# Patient Record
Sex: Female | Born: 1985
Health system: Southern US, Community
[De-identification: ages and names within clinical notes are randomized; demographics above are authoritative.]

## PROBLEM LIST (undated history)

## (undated) DIAGNOSIS — F329 Major depressive disorder, single episode, unspecified: Secondary | ICD-10-CM

## (undated) DIAGNOSIS — F419 Anxiety disorder, unspecified: Secondary | ICD-10-CM

## (undated) DIAGNOSIS — F32A Depression, unspecified: Secondary | ICD-10-CM

## (undated) HISTORY — DX: Anxiety disorder, unspecified: F41.9

## (undated) HISTORY — DX: Depression, unspecified: F32.A

---
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
## 2013-12-08 DIAGNOSIS — R55 Syncope and collapse: Secondary | ICD-10-CM

## 2013-12-08 HISTORY — DX: Syncope and collapse: R55

## 2014-12-08 HISTORY — PX: PACEMAKER IMPLANT: EP1218

## 2019-11-01 ENCOUNTER — Encounter: Payer: Self-pay | Admitting: Registered Nurse

## 2019-11-01 ENCOUNTER — Ambulatory Visit (INDEPENDENT_AMBULATORY_CARE_PROVIDER_SITE_OTHER): Payer: 59 | Admitting: Registered Nurse

## 2019-11-01 ENCOUNTER — Other Ambulatory Visit: Payer: Self-pay

## 2019-11-01 VITALS — BP 102/66 | HR 73 | Temp 99.4°F | Wt 144.2 lb

## 2019-11-01 DIAGNOSIS — F419 Anxiety disorder, unspecified: Secondary | ICD-10-CM | POA: Diagnosis not present

## 2019-11-01 DIAGNOSIS — Z950 Presence of cardiac pacemaker: Secondary | ICD-10-CM | POA: Diagnosis not present

## 2019-11-01 DIAGNOSIS — R55 Syncope and collapse: Secondary | ICD-10-CM

## 2019-11-01 DIAGNOSIS — Z0000 Encounter for general adult medical examination without abnormal findings: Secondary | ICD-10-CM

## 2019-11-01 DIAGNOSIS — F329 Major depressive disorder, single episode, unspecified: Secondary | ICD-10-CM

## 2019-11-01 DIAGNOSIS — Z0001 Encounter for general adult medical examination with abnormal findings: Secondary | ICD-10-CM

## 2019-11-01 DIAGNOSIS — F32A Depression, unspecified: Secondary | ICD-10-CM

## 2019-11-01 MED ORDER — BUSPIRONE HCL 7.5 MG PO TABS: 7.5000 mg | ORAL_TABLET | Freq: Three times a day (TID) | ORAL | 0 refills | Status: DC
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
## 2019-11-01 NOTE — Patient Instructions (Signed)
° ° ° °  If you have lab work done today you will be contacted with your lab results within the next 2 weeks.  If you have not heard from us then please contact us. The fastest way to get your results is to register for My Chart. ° ° °IF you received an x-ray today, you will receive an invoice from [facility]. Please contact [facility] at [phone] with questions or concerns regarding your invoice.  ° °IF you received labwork today, you will receive an invoice from [facility]. Please contact [facility] at [phone] with questions or concerns regarding your invoice.  ° °Our billing staff will not be able to assist you with questions regarding bills from these companies. ° °You will be contacted with the lab results as soon as they are available. The fastest way to get your results is to activate your My Chart account. Instructions are located on the last page of this paperwork. If you have not heard from us regarding the results in 2 weeks, please contact this office. °  ° ° ° °

## 2019-11-09 ENCOUNTER — Other Ambulatory Visit: Payer: Self-pay | Admitting: Registered Nurse

## 2019-11-09 DIAGNOSIS — F419 Anxiety disorder, unspecified: Secondary | ICD-10-CM

## 2019-11-09 NOTE — Telephone Encounter (Signed)
Requested medication (s) are due for refill today: yes  Requested medication (s) are on the active medication list: yes  Last refill:  11/02/2019  Future visit scheduled: no  Notes to clinic:  Requesting a 90 day supply Review for refill   Requested Prescriptions  Pending Prescriptions Disp Refills   busPIRone (BUSPAR) 7.5 MG tablet [Pharmacy Med Name: BUSPIRONE HCL 7.5 MG TABLET] 270 tablet 1    Sig: Take 1 tablet (7.5 mg total) by mouth 3 (three) times daily.     Psychiatry: Anxiolytics/Hypnotics - Non-controlled Passed - 11/09/2019  8:30 AM      Passed - Valid encounter within last 6 months    Recent Outpatient Visits          1 week ago Annual physical exam   [facility], Delfino Lovett, NP      Future Appointments            In 6 days Tobb, Godfrey Pick, DO [place]

## 2019-11-09 NOTE — Telephone Encounter (Signed)
Patient would like a 90 day supply. She has an upcoming appt with you on  11/28/19

## 2019-11-14 ENCOUNTER — Encounter: Payer: Self-pay | Admitting: Registered Nurse

## 2019-11-14 ENCOUNTER — Ambulatory Visit (INDEPENDENT_AMBULATORY_CARE_PROVIDER_SITE_OTHER): Payer: 59 | Admitting: Registered Nurse

## 2019-11-14 ENCOUNTER — Other Ambulatory Visit: Payer: Self-pay

## 2019-11-14 VITALS — BP 113/63 | HR 80 | Temp 98.7°F | Resp 12 | Ht 67.25 in | Wt 143.0 lb

## 2019-11-14 DIAGNOSIS — S46811A Strain of other muscles, fascia and tendons at shoulder and upper arm level, right arm, initial encounter: Secondary | ICD-10-CM | POA: Diagnosis not present

## 2019-11-14 MED ORDER — CYCLOBENZAPRINE HCL 10 MG PO TABS: 10.0000 mg | ORAL_TABLET | Freq: Three times a day (TID) | ORAL | 0 refills | Status: DC | PRN

## 2019-11-14 MED ORDER — MELOXICAM 15 MG PO TABS: 15.0000 mg | ORAL_TABLET | Freq: Every day | ORAL | 0 refills | Status: DC

## 2019-11-14 NOTE — Progress Notes (Signed)
Established Patient Office Visit  Subjective:  Patient ID: Kelsey Ewing, female    DOB: 11/03/1986  Age: 33 y.o. MRN: [id number]  CC:  Chief Complaint  Patient presents with  . Neck Injury    pt stated-riding horse that stumble---heard something popped, pain when turning the head down, tingling sensation Rt shoulder/arm--1 week. PHQ-6,  GAD7=4.    HPI Kelsey Ewing presents for R shoulder and back pain.  Ongoing for a little over a week. Recently [place] time [place] of [place] with family - sleeping on bed she did not think was right for her. Then was horseback riding on Saturday - said horse stumbled on a step, felt an acute injury occur on the R side of her back. Pain R of spine, from C7 to T10. Radiates towards shoulder and posterior of axilla. No pain in ROM of shoulder. R arm feels "off", but denies numbness, tingling, weakness. Denies systemic neurological symptoms - no changes in lower extremities, no loss of bowel or bladder control  Past Medical History:  Diagnosis Date  . Anxiety   . Depression   . Neurocardiogenic syncope 2015    Past Surgical History:  Procedure Laterality Date  . PACEMAKER IMPLANT  2016    Family History  Problem Relation Age of Onset  . Hyperlipidemia Mother   . Diabetes Father   . Hypertension Father     Social History   Socioeconomic History  . Marital status: Single    Spouse name: Not on file  . Number of children: 0  . Years of education: Not on file  . Highest education level: Not on file  Occupational History  . Occupation: Software engineer  Social Needs  . Financial resource strain: Not hard at all  . Food insecurity    Worry: Never true    Inability: Never true  . Transportation needs    Medical: No    Non-medical: No  Tobacco Use  . Smoking status: Never Smoker  . Smokeless tobacco: Never Used  Substance and Sexual Activity  . Alcohol use: Yes    Comment: 0-8 a week  . Drug use: Never  . Sexual activity: Yes   Lifestyle  . Physical activity    Days per week: Patient refused    Minutes per session: Patient refused  . Stress: To some extent  Relationships  . Social Herbalist on phone: Three times a week    Gets together: Twice a week    Attends religious service: Patient refused    Active member of club or organization: Patient refused    Attends meetings of clubs or organizations: Patient refused    Relationship status: Patient refused  . Intimate partner violence    Fear of current or ex partner: No    Emotionally abused: No    Physically abused: No    Forced sexual activity: No  Other Topics Concern  . Not on file  Social History Narrative  . Not on file    Outpatient Medications Prior to Visit  Medication Sig Dispense Refill  . busPIRone (BUSPAR) 7.5 MG tablet TAKE 1 TABLET (7.5 MG TOTAL) BY MOUTH 3 (THREE) TIMES DAILY. 270 tablet 1   No facility-administered medications prior to visit.     No Known Allergies  ROS Review of Systems  Constitutional: Negative.   HENT: Negative.   Eyes: Negative.   Respiratory: Negative.   Cardiovascular: Negative.   Gastrointestinal: Negative.   Endocrine: Negative.   Genitourinary: Negative.  Musculoskeletal: Positive for arthralgias, back pain and myalgias.  Skin: Negative.   Allergic/Immunologic: Negative.   Neurological: Negative.   Hematological: Negative.   Psychiatric/Behavioral: Negative.   All other systems reviewed and are negative.     Objective:    Physical Exam  Constitutional: She is oriented to person, place, and time. She appears well-developed and well-nourished. No distress.  Cardiovascular: Normal rate and regular rhythm.  Pulmonary/Chest: Effort normal and breath sounds normal. No respiratory distress.  Musculoskeletal:     Right shoulder: She exhibits tenderness and bony tenderness. She exhibits normal range of motion and no swelling.     Cervical back: She exhibits tenderness. She exhibits  normal range of motion, no bony tenderness, no swelling, no edema and no deformity.     Thoracic back: She exhibits tenderness. She exhibits normal range of motion, no bony tenderness, no swelling, no edema and no deformity.       Back:  Neurological: She is alert and oriented to person, place, and time.  Skin: Skin is warm and dry. No rash noted. She is not diaphoretic. No erythema. No pallor.  Psychiatric: She has a normal mood and affect. Her behavior is normal. Judgment and thought content normal.  Nursing note and vitals reviewed.   BP 113/63 (BP Location: Right Arm, Patient Position: Sitting, Cuff Size: Normal)   Pulse 80   Temp 98.7 F (37.1 C)   Resp 12   Ht 5' 7.25" (1.708 m)   Wt 143 lb (64.9 kg)   LMP 11/12/2019   SpO2 97%   BMI 22.23 kg/m  Wt Readings from Last 3 Encounters:  11/14/19 143 lb (64.9 kg)  11/01/19 144 lb 3.2 oz (65.4 kg)     There are no preventive care reminders to display for this patient.  There are no preventive care reminders to display for this patient.  No results found for: TSH No results found for: WBC, HGB, HCT, MCV, PLT No results found for: NA, K, CHLORIDE, CO2, GLUCOSE, BUN, CREATININE, BILITOT, ALKPHOS, AST, ALT, PROT, ALBUMIN, CALCIUM, ANIONGAP, EGFR, GFR No results found for: CHOL No results found for: HDL No results found for: LDLCALC No results found for: TRIG No results found for: CHOLHDL No results found for: HGBA1C    Assessment & Plan:   Problem List Items Addressed This Visit    None    Visit Diagnoses    Trapezius muscle strain, right, initial encounter    -  Primary   Relevant Medications   meloxicam (MOBIC) 15 MG tablet   cyclobenzaprine (FLEXERIL) 10 MG tablet   Other Relevant Orders   Ambulatory referral to Physical Therapy      Meds ordered this encounter  Medications  . meloxicam (MOBIC) 15 MG tablet    Sig: Take 1 tablet (15 mg total) by mouth daily.    Dispense:  30 tablet    Refill:  0    Order  Specific Question:   Supervising Provider    Answer:   Delia Chimes A [id number]  . cyclobenzaprine (FLEXERIL) 10 MG tablet    Sig: Take 1 tablet (10 mg total) by mouth 3 (three) times daily as needed for muscle spasms.    Dispense:  30 tablet    Refill:  0    Order Specific Question:   Supervising Provider    Answer:   Forrest Moron [id number]    Follow-up: No follow-ups on file.   PLAN  Trapezius muscles strain  Flexeril and meloxicam  Referral to PT  Full ROM intact  Patient encouraged to call clinic with any questions, comments, or concerns.   Maximiano Coss, NP

## 2019-11-15 ENCOUNTER — Ambulatory Visit: Payer: 59 | Admitting: Cardiology

## 2019-11-16 ENCOUNTER — Encounter: Payer: Self-pay | Admitting: Cardiology

## 2019-11-16 ENCOUNTER — Ambulatory Visit (INDEPENDENT_AMBULATORY_CARE_PROVIDER_SITE_OTHER): Payer: 59 | Admitting: Cardiology

## 2019-11-16 ENCOUNTER — Other Ambulatory Visit: Payer: Self-pay

## 2019-11-16 ENCOUNTER — Ambulatory Visit (HOSPITAL_BASED_OUTPATIENT_CLINIC_OR_DEPARTMENT_OTHER)
Admission: RE | Admit: 2019-11-16 | Discharge: 2019-11-16 | Disposition: A | Discharge status: Home / Self Care | Payer: 59 | Source: Ambulatory Visit | Attending: Cardiology | Admitting: Cardiology

## 2019-11-16 VITALS — BP 100/60 | HR 72 | Ht 67.25 in | Wt 146.0 lb

## 2019-11-16 DIAGNOSIS — Z950 Presence of cardiac pacemaker: Secondary | ICD-10-CM | POA: Diagnosis present

## 2019-11-16 DIAGNOSIS — R55 Syncope and collapse: Secondary | ICD-10-CM | POA: Diagnosis not present

## 2019-11-16 DIAGNOSIS — I495 Sick sinus syndrome: Secondary | ICD-10-CM | POA: Diagnosis not present

## 2019-11-16 NOTE — Progress Notes (Signed)
Cardiology Office Note:    Date:  11/16/2019   ID:  Kelsey Ewing, DOB 1986-05-03, MRN [id number]  PCP:  Janeece Agee, NP  Cardiologist:  Thomasene Ripple, DO  Electrophysiologist:  None   Referring MD: Janeece Agee, NP   Chief Complaint  Patient presents with  . Establish Care    Has pacemker    History of Present Illness:    Kelsey Ewing is a 33 y.o. female with a hx of sick sinus syndrome status status post Medtronic pacemaker (patient this is a single lead RA pacemaker) implanted in August 2016, neurocardiogenic shock, and anxiety and depression. The patient presents today to establish cardiac care.  She moved from [place] in January 2019 to [place].  She tells me that teenager she had multiple seizures school as well as to both fainting spells and at that time was checked out with no clear explanation.  She states that while she was in college she did have similar episodes.  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  States that the soup was feeling of she sensation while she was standing and soon will have what she described" dj vu" feeling like someone is either walking out and she will soon [place] for few minutes.  She states that during her work-up [place] she wore a Holter monitor 3 days and her initial Holter monitor sure 10-second pause.  Then she was placed in 1 longer duration of ambulatory monitor up to 30 days event monitor showed multiple episodes of sinus pauses mostly 10 seconds.  With this it was recommended that the patient undergo pacemaker implantation for sick sinus syndrome.  She tells me that her last pacemaker interrogation was 2018 at the later half of the year at that time she was told that everything was within normal limits.  Today she does not offer any complaints at this time, she denies any chest pain, shortness of breath, dizziness or any syncope episodes.  Past Medical History:  Diagnosis Date  . Anxiety   . Depression    . Neurocardiogenic syncope 2015    Past Surgical History:  Procedure Laterality Date  . PACEMAKER IMPLANT  2016    Current Medications: Current Meds  Medication Sig  . busPIRone (BUSPAR) 7.5 MG tablet TAKE 1 TABLET (7.5 MG TOTAL) BY MOUTH 3 (THREE) TIMES DAILY.  . cyclobenzaprine (FLEXERIL) 10 MG tablet Take 1 tablet (10 mg total) by mouth 3 (three) times daily as needed for muscle spasms.  . meloxicam (MOBIC) 15 MG tablet Take 1 tablet (15 mg total) by mouth daily.     Allergies:   Patient has no known allergies.   Social History   Socioeconomic History  . Marital status: Single    Spouse name: Not on file  . Number of children: 0  . Years of education: Not on file  . Highest education level: Not on file  Occupational History  . Occupation: Teacher, early years/pre  Social Needs  . Financial resource strain: Not hard at all  . Food insecurity    Worry: Never true    Inability: Never true  . Transportation needs    Medical: No    Non-medical: No  Tobacco Use  . Smoking status: Never Smoker  . Smokeless tobacco: Never Used  Substance and Sexual Activity  . Alcohol use: Yes    Comment: 0-8 a week  . Drug use: Never  . Sexual activity: Yes  Lifestyle  . Physical activity    Days  per week: Patient refused    Minutes per session: Patient refused  . Stress: To some extent  Relationships  . Social Herbalist on phone: Three times a week    Gets together: Twice a week    Attends religious service: Patient refused    Active member of club or organization: Patient refused    Attends meetings of clubs or organizations: Patient refused    Relationship status: Patient refused  Other Topics Concern  . Not on file  Social History Narrative  . Not on file     Family History: The patient's family history includes Congestive Heart Failure in her maternal grandfather; Diabetes in her father; Heart disease in her maternal grandfather; Hyperlipidemia in her mother;  Hypertension in her father.  ROS:   Review of Systems  Constitution: Negative for decreased appetite, fever and weight gain.  HENT: Negative for congestion, ear discharge, hoarse voice and sore throat.   Eyes: Negative for discharge, redness, vision loss in right eye and visual halos.  Cardiovascular: Negative for chest pain, dyspnea on exertion, leg swelling, orthopnea and palpitations.  Respiratory: Negative for cough, hemoptysis, shortness of breath and snoring.   Endocrine: Negative for heat intolerance and polyphagia.  Hematologic/Lymphatic: Negative for bleeding problem. Does not bruise/bleed easily.  Skin: Negative for flushing, nail changes, rash and suspicious lesions.  Musculoskeletal: Negative for arthritis, joint pain, muscle cramps, myalgias, neck pain and stiffness.  Gastrointestinal: Negative for abdominal pain, bowel incontinence, diarrhea and excessive appetite.  Genitourinary: Negative for decreased libido, genital sores and incomplete emptying.  Neurological: Negative for brief paralysis, focal weakness, headaches and loss of balance.  Psychiatric/Behavioral: Negative for altered mental status, depression and suicidal ideas.  Allergic/Immunologic: Negative for HIV exposure and persistent infections.    EKGs/Labs/Other Studies Reviewed:    The following studies were reviewed today:   EKG:  The ekg ordered today demonstrates sinus rhythm, heart rate 80 bpm with no prior ECG for comparison.  Recent Labs: No results found for requested labs within last 8760 hours.  Recent Lipid Panel No results found for: CHOL, TRIG, HDL, CHOLHDL, VLDL, LDLCALC, LDLDIRECT  Physical Exam:    VS:  BP 100/60 (BP Location: Left Arm, Patient Position: Sitting, Cuff Size: Normal)   Pulse 72   Ht 5' 7.25" (1.708 m)   Wt 146 lb (66.2 kg)   LMP 11/12/2019   SpO2 98%   BMI 22.70 kg/m     Wt Readings from Last 3 Encounters:  11/16/19 146 lb (66.2 kg)  11/14/19 143 lb (64.9 kg)   11/01/19 144 lb 3.2 oz (65.4 kg)     GEN: Well nourished, well developed in no acute distress HEENT: Normal NECK: No JVD; No carotid bruits LYMPHATICS: No lymphadenopathy CARDIAC: S1S2 noted,RRR, no murmurs, rubs, gallops RESPIRATORY:  Clear to auscultation without rales, wheezing or rhonchi  ABDOMEN: Soft, non-tender, non-distended, +bowel sounds, no guarding. EXTREMITIES: No edema, No cyanosis, no clubbing MUSCULOSKELETAL:  No edema; No deformity  SKIN: Warm and dry NEUROLOGIC:  Alert and oriented x 3, non-focal PSYCHIATRIC:  Normal affect, good insight  ASSESSMENT:    1. Sick sinus syndrome (HCC)   2. Cardiac pacemaker in situ   3. Neurocardiogenic syncope    PLAN:    He appears to be doing well from a cardiovascular standpoint.  She already have appointment with our pacemaker clinic on November 28, 2019.  For now with the establishment of her care I like to do a plain film chest  x-ray to make sure her leads are in place and have this for baseline.  I will also review her port from the pacemaker clinic when this is done on November 28, 2019.  The patient is in agreement with the above plan. The patient left the office in stable condition.  The patient will follow up in 1 year or sooner if needed.   Medication Adjustments/Labs and Tests Ordered: Current medicines are reviewed at length with the patient today.  Concerns regarding medicines are outlined above.  Orders Placed This Encounter  Procedures  . DG Chest 2 View  . EKG 12-Lead   No orders of the defined types were placed in this encounter.   Patient Instructions  Medication Instructions:  Your physician recommends that you continue on your current medications as directed. Please refer to the Current Medication list given to you today.  *If you need a refill on your cardiac medications before your next appointment, please call your pharmacy*  Lab Work: None If you have labs (blood work) drawn today and your  tests are completely normal, you will receive your results only by: Marland Kitchen MyChart Message (if you have MyChart) OR . A paper copy in the mail If you have any lab test that is abnormal or we need to change your treatment, we will call you to review the results.  Testing/Procedures: A chest x-ray takes a picture of the organs and structures inside the chest, including the heart, lungs, and blood vessels. This test can show several things, including, whether the heart is enlarges; whether fluid is building up in the lungs; and whether pacemaker / defibrillator leads are still in place.  Follow-Up: At [facility], you and your health needs are our priority.  As part of our continuing mission to provide you with exceptional heart care, we have created designated Provider Care Teams.  These Care Teams include your primary Cardiologist (physician) and Advanced Practice Providers (APPs -  Physician Assistants and Nurse Practitioners) who all work together to provide you with the care you need, when you need it.  Your next appointment:   12 month(s)  The format for your next appointment:   In Person  Provider:   Thomasene Ripple, DO  Other Instructions      Adopting a Healthy Lifestyle.  Know what a healthy weight is for you (roughly BMI <25) and aim to maintain this   Aim for 7+ servings of fruits and vegetables daily   65-80+ fluid ounces of water or unsweet tea for healthy kidneys   Limit to max 1 drink of alcohol per day; avoid smoking/tobacco   Limit animal fats in diet for cholesterol and heart health - choose grass fed whenever available   Avoid highly processed foods, and foods high in saturated/trans fats   Aim for low stress - take time to unwind and care for your mental health   Aim for 150 min of moderate intensity exercise weekly for heart health, and weights twice weekly for bone health   Aim for 7-9 hours of sleep daily   When it comes to diets, agreement about the  perfect plan isnt easy to find, even among the experts. Experts at the [facility] [employer] developed an idea known as the Healthy Eating Plate. Just imagine a plate divided into logical, healthy portions.   The emphasis is on diet quality:   Load up on vegetables and fruits - one-half of your plate: Aim for color and variety, and remember  that potatoes dont count.   Go for whole grains - one-quarter of your plate: Whole wheat, barley, wheat berries, quinoa, oats, brown rice, and foods made with them. If you want pasta, go with whole wheat pasta.   Protein power - one-quarter of your plate: Fish, chicken, beans, and nuts are all healthy, versatile protein sources. Limit red meat.   The diet, however, does go beyond the plate, offering a few other suggestions.   Use healthy plant oils, such as olive, canola, soy, corn, sunflower and peanut. Check the labels, and avoid partially hydrogenated oil, which have unhealthy trans fats.   If youre thirsty, drink water. Coffee and tea are good in moderation, but skip sugary drinks and limit milk and dairy products to one or two daily servings.   The type of carbohydrate in the diet is more important than the amount. Some sources of carbohydrates, such as vegetables, fruits, whole grains, and beans-are healthier than others.   Finally, stay active  Signed, Thomasene RippleKardie Jheremy Boger, DO  11/16/2019 1:51 PM    [facility]

## 2019-11-16 NOTE — Patient Instructions (Signed)
Medication Instructions:  Your physician recommends that you continue on your current medications as directed. Please refer to the Current Medication list given to you today.  *If you need a refill on your cardiac medications before your next appointment, please call your pharmacy*  Lab Work: None If you have labs (blood work) drawn today and your tests are completely normal, you will receive your results only by: Marland Kitchen MyChart Message (if you have MyChart) OR . A paper copy in the mail If you have any lab test that is abnormal or we need to change your treatment, we will call you to review the results.  Testing/Procedures: A chest x-ray takes a picture of the organs and structures inside the chest, including the heart, lungs, and blood vessels. This test can show several things, including, whether the heart is enlarges; whether fluid is building up in the lungs; and whether pacemaker / defibrillator leads are still in place.  Follow-Up: At [facility], you and your health needs are our priority.  As part of our continuing mission to provide you with exceptional heart care, we have created designated Provider Care Teams.  These Care Teams include your primary Cardiologist (physician) and Advanced Practice Providers (APPs -  Physician Assistants and Nurse Practitioners) who all work together to provide you with the care you need, when you need it.  Your next appointment:   12 month(s)  The format for your next appointment:   In Person  Provider:   Berniece Salines, DO  Other Instructions

## 2019-11-17 ENCOUNTER — Telehealth: Payer: Self-pay | Admitting: Registered Nurse

## 2019-11-17 ENCOUNTER — Other Ambulatory Visit: Payer: Self-pay | Admitting: Emergency Medicine

## 2019-11-17 ENCOUNTER — Telehealth: Payer: Self-pay | Admitting: *Deleted

## 2019-11-17 DIAGNOSIS — Z13220 Encounter for screening for lipoid disorders: Secondary | ICD-10-CM

## 2019-11-17 DIAGNOSIS — Z0000 Encounter for general adult medical examination without abnormal findings: Secondary | ICD-10-CM

## 2019-11-17 NOTE — Telephone Encounter (Signed)
Telephone call to patient. Left message with chest xray results and to call with any questions.

## 2019-11-17 NOTE — Telephone Encounter (Signed)
This patient will call again tomorrow to see if labs are put in chart so they can come in a head of time to get labs done. I see this patient had physical done on 11/01/19.

## 2019-11-17 NOTE — Telephone Encounter (Signed)
Pt called to see if she could come in and get her labs drawn from the annual physical exam visit,   Although labs do not seem to be in the system pt will call back  11/18/19 to see if there are any lab orders put in

## 2019-11-17 NOTE — Telephone Encounter (Signed)
-----   Message from Berniece Salines, DO sent at 11/16/2019  8:33 PM EST ----- cxr reviewed single lead pacemaker in situ.

## 2019-11-18 ENCOUNTER — Other Ambulatory Visit: Payer: Self-pay | Admitting: Registered Nurse

## 2019-11-18 DIAGNOSIS — Z13220 Encounter for screening for lipoid disorders: Secondary | ICD-10-CM

## 2019-11-18 DIAGNOSIS — Z13228 Encounter for screening for other metabolic disorders: Secondary | ICD-10-CM

## 2019-11-18 NOTE — Progress Notes (Signed)
Future orders placed to have labs done in advance of next visit  Kathrin Ruddy, NP

## 2019-11-18 NOTE — Telephone Encounter (Signed)
I have placed the orders - She is welcome to come in at her convenience.   Kathrin Ruddy, NP

## 2019-11-21 NOTE — Telephone Encounter (Signed)
Patient has been informed and will try to come in tomorrow to get labs done

## 2019-11-22 ENCOUNTER — Ambulatory Visit (INDEPENDENT_AMBULATORY_CARE_PROVIDER_SITE_OTHER): Payer: 59 | Admitting: Registered Nurse

## 2019-11-22 ENCOUNTER — Other Ambulatory Visit: Payer: Self-pay

## 2019-11-22 DIAGNOSIS — Z13220 Encounter for screening for lipoid disorders: Secondary | ICD-10-CM

## 2019-11-22 DIAGNOSIS — Z1329 Encounter for screening for other suspected endocrine disorder: Secondary | ICD-10-CM

## 2019-11-22 DIAGNOSIS — Z13228 Encounter for screening for other metabolic disorders: Secondary | ICD-10-CM

## 2019-11-22 DIAGNOSIS — Z130 Encounter for screening for diseases of the blood and blood-forming organs and certain disorders involving the immune mechanism: Secondary | ICD-10-CM

## 2019-11-23 ENCOUNTER — Encounter: Payer: Self-pay | Admitting: Registered Nurse

## 2019-11-23 LAB — CBC WITH DIFFERENTIAL/PLATELET
Basophils Absolute: 0 10*3/uL (ref 0.0–0.2)
Basos: 1 %
EOS (ABSOLUTE): 0.1 10*3/uL (ref 0.0–0.4)
Eos: 1 %
Hematocrit: 40.9 % (ref 34.0–46.6)
Hemoglobin: 13.8 g/dL (ref 11.1–15.9)
Immature Grans (Abs): 0 10*3/uL (ref 0.0–0.1)
Immature Granulocytes: 0 %
Lymphocytes Absolute: 1.4 10*3/uL (ref 0.7–3.1)
Lymphs: 28 %
MCH: 29.9 pg (ref 26.6–33.0)
MCHC: 33.7 g/dL (ref 31.5–35.7)
MCV: 89 fL (ref 79–97)
Monocytes Absolute: 0.5 10*3/uL (ref 0.1–0.9)
Monocytes: 9 %
Neutrophils Absolute: 3 10*3/uL (ref 1.4–7.0)
Neutrophils: 61 %
Platelets: 191 10*3/uL (ref 150–450)
RBC: 4.61 x10E6/uL (ref 3.77–5.28)
RDW: 12.9 % (ref 11.7–15.4)
WBC: 5 10*3/uL (ref 3.4–10.8)

## 2019-11-23 LAB — COMPREHENSIVE METABOLIC PANEL
ALT: 10 IU/L (ref 0–32)
AST: 17 IU/L (ref 0–40)
Albumin/Globulin Ratio: 1.9 (ref 1.2–2.2)
Albumin: 4.4 g/dL (ref 3.8–4.8)
Alkaline Phosphatase: 48 IU/L (ref 39–117)
BUN/Creatinine Ratio: 18 (ref 9–23)
BUN: 13 mg/dL (ref 6–20)
Bilirubin Total: 0.4 mg/dL (ref 0.0–1.2)
CO2: 25 mmol/L (ref 20–29)
Calcium: 9.2 mg/dL (ref 8.7–10.2)
Chloride: 101 mmol/L (ref 96–106)
Creatinine, Ser: 0.72 mg/dL (ref 0.57–1.00)
GFR calc Af Amer: 126 mL/min/{1.73_m2} (ref >59)
GFR calc non Af Amer: 110 mL/min/{1.73_m2} (ref >59)
Globulin, Total: 2.3 g/dL (ref 1.5–4.5)
Glucose: 86 mg/dL (ref 65–99)
Potassium: 4.5 mmol/L (ref 3.5–5.2)
Sodium: 137 mmol/L (ref 134–144)
Total Protein: 6.7 g/dL (ref 6.0–8.5)

## 2019-11-23 LAB — TSH: TSH: 1.44 u[IU]/mL (ref 0.450–4.500)

## 2019-11-23 LAB — LIPID PANEL
Chol/HDL Ratio: 3.2 ratio (ref 0.0–4.4)
Cholesterol, Total: 177 mg/dL (ref 100–199)
HDL: 56 mg/dL (ref >39)
LDL Chol Calc (NIH): 110 mg/dL — ABNORMAL HIGH (ref 0–99)
Triglycerides: 55 mg/dL (ref 0–149)
VLDL Cholesterol Cal: 11 mg/dL (ref 5–40)

## 2019-11-23 NOTE — Progress Notes (Signed)
Result returned LDL to 110, suggest lifestyle management Letter sent to pt via mychart  Kathrin Ruddy, NP

## 2019-11-28 ENCOUNTER — Ambulatory Visit (INDEPENDENT_AMBULATORY_CARE_PROVIDER_SITE_OTHER): Payer: 59 | Admitting: Cardiology

## 2019-11-28 ENCOUNTER — Other Ambulatory Visit: Payer: Self-pay

## 2019-11-28 ENCOUNTER — Encounter: Payer: Self-pay | Admitting: Cardiology

## 2019-11-28 VITALS — BP 96/60 | HR 91 | Ht 67.25 in | Wt 145.8 lb

## 2019-11-28 DIAGNOSIS — I495 Sick sinus syndrome: Secondary | ICD-10-CM | POA: Diagnosis not present

## 2019-11-28 DIAGNOSIS — Z950 Presence of cardiac pacemaker: Secondary | ICD-10-CM | POA: Diagnosis not present

## 2019-11-28 DIAGNOSIS — R55 Syncope and collapse: Secondary | ICD-10-CM

## 2019-11-28 LAB — CUP PACEART INCLINIC DEVICE CHECK
Battery Remaining Longevity: 107 mo
Battery Voltage: 3.02 V
Brady Statistic RV Percent Paced: 0.37 %
Date Time Interrogation Session: 20201221090635
Implantable Lead Implant Date: 20160822
Implantable Lead Location: 753860
Implantable Lead Model: 5076
Implantable Pulse Generator Implant Date: 20160822
Lead Channel Impedance Value: 380 Ohm
Lead Channel Impedance Value: 494 Ohm
Lead Channel Pacing Threshold Amplitude: 0.75 V
Lead Channel Pacing Threshold Pulse Width: 0.4 ms
Lead Channel Sensing Intrinsic Amplitude: 11.125 mV
Lead Channel Setting Pacing Amplitude: 2 V
Lead Channel Setting Pacing Pulse Width: 0.4 ms
Lead Channel Setting Sensing Sensitivity: 2 mV

## 2019-11-28 NOTE — Patient Instructions (Signed)
Medication Instructions:  Your physician recommends that you continue on your current medications as directed. Please refer to the Current Medication list given to you today.  *If you need a refill on your cardiac medications before your next appointment, please call your pharmacy*  Labwork: None ordered  Testing/Procedures: None ordered  Follow-Up: Remote monitoring is used to monitor your Pacemaker or ICD from home. This monitoring reduces the number of office visits required to check your device to one time per year. It allows [place] to keep an eye on the functioning of your device to ensure it is working properly. You are scheduled for a device check from home on 02/27/2020. You may send your transmission at any time that day. If you have a wireless device, the transmission will be sent automatically. After your physician reviews your transmission, you will receive a postcard with your next transmission date.  At [facility], you and your health needs are our priority.  As part of our continuing mission to provide you with exceptional heart care, we have created designated Provider Care Teams.  These Care Teams include your primary Cardiologist (physician) and Advanced Practice Providers (APPs -  Physician Assistants and Nurse Practitioners) who all work together to provide you with the care you need, when you need it.  You will need a follow up appointment in 12 months.  Please call our office 2 months in advance to schedule this appointment.  You may see Dr Curt Bears or one of the following Advanced Practice Providers on your designated Care Team:    Chanetta Marshall, NP  Tommye Standard, PA-C  Oda Kilts, [place]  Thank you for choosing [facility]!!   Trinidad Curet, RN [phone]

## 2019-11-28 NOTE — Progress Notes (Signed)
Electrophysiology Office Note   Date:  11/28/2019   ID:  Kelsey Ewing, DOB January 18, 1986, MRN [id number]  PCP:  Maximiano Coss, NP  Cardiologist:  Harriet Masson Primary Electrophysiologist:  Jackye Dever Meredith Leeds, MD    Chief Complaint: pacemaker   History of Present Illness: Kelsey Ewing is a 33 y.o. female who is being seen today for the evaluation of pacemaker at the request of Maximiano Coss, NP. Presenting today for electrophysiology evaluation.  She has a history of sick sinus syndrome status post Medtronic single-chamber pacemaker implanted August 2016, neurocardiogenic syncope, anxiety, and depression.  Her pacemaker is a single-chamber right atrial device.  She moved from [place] to [place] January 2019.  As a teenager, she had multiple seizures and fainting spells.  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  Those improved for short period.  She had similar episodes in college.  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  She wore a Holter monitor that showed a 10-second pause she thus had a pacemaker implant.  Pacemaker implant, she has felt improved with less symptoms.  She usually has episodes of dj vu prior to her episodes of near syncope.  She has her episodes once every few months which is quite improved.  Today, she denies symptoms of palpitations, chest pain, shortness of breath, orthopnea, PND, lower extremity edema, claudication, dizziness, presyncope, syncope, bleeding, or neurologic sequela. The patient is tolerating medications without difficulties.    Past Medical History:  Diagnosis Date  . Anxiety   . Depression   . Neurocardiogenic syncope 2015   Past Surgical History:  Procedure Laterality Date  . PACEMAKER IMPLANT  2016     Current Outpatient Medications  Medication Sig Dispense Refill  . busPIRone (BUSPAR) 7.5 MG tablet TAKE 1 TABLET (7.5 MG TOTAL) BY MOUTH 3 (THREE) TIMES DAILY. 270 tablet 1  .  cyclobenzaprine (FLEXERIL) 10 MG tablet Take 1 tablet (10 mg total) by mouth 3 (three) times daily as needed for muscle spasms. 30 tablet 0  . meloxicam (MOBIC) 15 MG tablet Take 1 tablet (15 mg total) by mouth daily. 30 tablet 0   No current facility-administered medications for this visit.    Allergies:   Patient has no known allergies.   Social History:  The patient  reports that she has never smoked. She has never used smokeless tobacco. She reports current alcohol use. She reports that she does not use drugs.   Family History:  The patient's family history includes Congestive Heart Failure in her maternal grandfather; Diabetes in her father; Heart disease in her maternal grandfather; Hyperlipidemia in her mother; Hypertension in her father.    ROS:  Please see the history of present illness.   Otherwise, review of systems is positive for none.   All other systems are reviewed and negative.    PHYSICAL EXAM: VS:  BP 96/60   Pulse 91   Ht 5' 7.25" (1.708 m)   Wt 145 lb 12.8 oz (66.1 kg)   LMP 11/12/2019   SpO2 98%   BMI 22.67 kg/m  , BMI Body mass index is 22.67 kg/m. GEN: Well nourished, well developed, in no acute distress  HEENT: normal  Neck: no JVD, carotid bruits, or masses Cardiac: RRR; no murmurs, rubs, or gallops,no edema  Respiratory:  clear to auscultation bilaterally, normal work of breathing GI: soft, nontender, nondistended, + BS MS: no deformity or atrophy  Skin: warm and dry, device pocket is  well healed Neuro:  Strength and sensation are intact Psych: euthymic mood, full affect  EKG:  EKG is not ordered today. Personal review of the ekg ordered 11/16/19 shows sinus rhythm, rate 70  Device interrogation is reviewed today in detail.  See PaceArt for details.   Recent Labs: 11/22/2019: ALT 10; BUN 13; Creatinine, Ser 0.72; Hemoglobin 13.8; Platelets 191; Potassium 4.5; Sodium 137; TSH 1.440    Lipid Panel     Component Value Date/Time   CHOL 177  11/22/2019 0846   TRIG 55 11/22/2019 0846   HDL 56 11/22/2019 0846   CHOLHDL 3.2 11/22/2019 0846   LDLCALC 110 (H) 11/22/2019 0846     Wt Readings from Last 3 Encounters:  11/28/19 145 lb 12.8 oz (66.1 kg)  11/16/19 146 lb (66.2 kg)  11/14/19 143 lb (64.9 kg)      Other studies Reviewed: Additional studies/ records that were reviewed today include: Epic notes   ASSESSMENT AND PLAN:  1.  Sick sinus syndrome: Apparently had 10-second pauses on cardiac monitor.  Is now status post Medtronic single-chamber right atrial pacemaker implanted April 2019.  Device functioning appropriately.  No changes.  We Brittanny Levenhagen work to get records from her cardiologist in [place].   Current medicines are reviewed at length with the patient today.   The patient does not have concerns regarding her medicines.  The following changes were made today:  none  Labs/ tests ordered today include:  No orders of the defined types were placed in this encounter.    Disposition:   FU with Mikah Poss 1 year  Signed, Montzerrat Brunell Jorja Loa, MD  11/28/2019 8:53 AM     [facility] [address] [place] [address] [phone] (office) [phone] (fax)

## 2019-11-29 ENCOUNTER — Encounter: Payer: Self-pay | Admitting: Registered Nurse

## 2019-11-29 ENCOUNTER — Ambulatory Visit: Payer: 59 | Admitting: Physical Therapy

## 2019-12-01 ENCOUNTER — Ambulatory Visit: Payer: 59 | Attending: Internal Medicine

## 2019-12-01 DIAGNOSIS — Z20822 Contact with and (suspected) exposure to covid-19: Secondary | ICD-10-CM

## 2019-12-02 ENCOUNTER — Other Ambulatory Visit: Payer: Self-pay | Admitting: Registered Nurse

## 2019-12-02 DIAGNOSIS — S46811A Strain of other muscles, fascia and tendons at shoulder and upper arm level, right arm, initial encounter: Secondary | ICD-10-CM

## 2019-12-03 LAB — NOVEL CORONAVIRUS, NAA: SARS-CoV-2, NAA: NOT DETECTED

## 2019-12-05 ENCOUNTER — Ambulatory Visit: Payer: 59 | Admitting: Physical Therapy

## 2019-12-11 ENCOUNTER — Other Ambulatory Visit: Payer: Self-pay | Admitting: Registered Nurse

## 2019-12-11 DIAGNOSIS — S46811A Strain of other muscles, fascia and tendons at shoulder and upper arm level, right arm, initial encounter: Secondary | ICD-10-CM

## 2019-12-12 ENCOUNTER — Ambulatory Visit (INDEPENDENT_AMBULATORY_CARE_PROVIDER_SITE_OTHER): Payer: 59 | Admitting: Psychology

## 2019-12-12 DIAGNOSIS — F320 Major depressive disorder, single episode, mild: Secondary | ICD-10-CM | POA: Diagnosis not present

## 2019-12-13 ENCOUNTER — Other Ambulatory Visit: Payer: Self-pay

## 2019-12-13 ENCOUNTER — Encounter: Payer: Self-pay | Admitting: Physical Therapy

## 2019-12-13 ENCOUNTER — Ambulatory Visit (INDEPENDENT_AMBULATORY_CARE_PROVIDER_SITE_OTHER): Payer: 59 | Admitting: Physical Therapy

## 2019-12-13 ENCOUNTER — Ambulatory Visit (INDEPENDENT_AMBULATORY_CARE_PROVIDER_SITE_OTHER): Payer: 59 | Admitting: Otolaryngology

## 2019-12-13 ENCOUNTER — Encounter (INDEPENDENT_AMBULATORY_CARE_PROVIDER_SITE_OTHER): Payer: Self-pay | Admitting: Otolaryngology

## 2019-12-13 VITALS — Temp 97.7°F

## 2019-12-13 DIAGNOSIS — M62838 Other muscle spasm: Secondary | ICD-10-CM | POA: Diagnosis not present

## 2019-12-13 DIAGNOSIS — I495 Sick sinus syndrome: Secondary | ICD-10-CM | POA: Diagnosis not present

## 2019-12-13 DIAGNOSIS — M542 Cervicalgia: Secondary | ICD-10-CM | POA: Diagnosis not present

## 2019-12-13 DIAGNOSIS — S022XXA Fracture of nasal bones, initial encounter for closed fracture: Secondary | ICD-10-CM

## 2019-12-13 NOTE — Patient Instructions (Signed)
Access Code: [email or web address]  URL: [email or web address]  Date: 12/13/2019  Prepared by: Sedalia Muta   Exercises Supine Single Arm Pectoralis Stretch - 3 reps - 30 hold - 2x daily Doorway Pec Stretch at 60 Elevation - 3 reps - 30 hold - 2x daily Seated Scapular Retraction - 10 reps - 1 sets - 3x daily Seated Upper Trapezius Stretch - 3 reps - 30 hold - 2x daily Standing Backward Shoulder Rolls - 10 reps - 1 sets - 3x daily

## 2019-12-13 NOTE — Progress Notes (Signed)
HPI: Kelsey Ewing is a 34 y.o. female who presents for evaluation of broken nose.  This apparently occurred back in September when she was working at the farm and a piece of equipment struck her on the dorsum of her nose causing a small laceration and bleeding from her nose.  She does not complain of any difficulty breathing through her nose.  But she feels like the prominent dorsal hump has changed some.  She has always had a prominent nasal dorsum or " Roman" type nose.  She never had any x-rays or scans following the nasal trauma. She does have a pacemaker for sick sinus syndrome.  Past Medical History:  Diagnosis Date  . Anxiety   . Depression   . Neurocardiogenic syncope 2015   Past Surgical History:  Procedure Laterality Date  . PACEMAKER IMPLANT  2016   Social History   Socioeconomic History  . Marital status: Single    Spouse name: Not on file  . Number of children: 0  . Years of education: Not on file  . Highest education level: Not on file  Occupational History  . Occupation: pharmacist  Tobacco Use  . Smoking status: Never Smoker  . Smokeless tobacco: Never Used  Substance and Sexual Activity  . Alcohol use: Yes    Comment: 0-8 a week  . Drug use: Never  . Sexual activity: Yes  Other Topics Concern  . Not on file  Social History Narrative  . Not on file   Social Determinants of Health   Financial Resource Strain: Low Risk   . Difficulty of Paying Living Expenses: Not hard at all  Food Insecurity: No Food Insecurity  . Worried About Charity fundraiser in the Last Year: Never true  . Ran Out of Food in the Last Year: Never true  Transportation Needs: No Transportation Needs  . Lack of Transportation (Medical): No  . Lack of Transportation (Non-Medical): No  Physical Activity: Unknown  . Days of Exercise per Week: Patient refused  . Minutes of Exercise per Session: Patient refused  Stress: Stress Concern Present  . Feeling of Stress : To some extent   Social Connections: Unknown  . Frequency of Communication with Friends and Family: Three times a week  . Frequency of Social Gatherings with Friends and Family: Twice a week  . Attends Religious Services: Patient refused  . Active Member of Clubs or Organizations: Patient refused  . Attends Archivist Meetings: Patient refused  . Marital Status: Patient refused   Family History  Problem Relation Age of Onset  . Hyperlipidemia Mother   . Diabetes Father   . Hypertension Father   . Heart disease Maternal Grandfather   . Congestive Heart Failure Maternal Grandfather    No Known Allergies Prior to Admission medications   Medication Sig Start Date End Date Taking? Authorizing Provider  busPIRone (BUSPAR) 7.5 MG tablet TAKE 1 TABLET (7.5 MG TOTAL) BY MOUTH 3 (THREE) TIMES DAILY. 11/11/19  Yes Maximiano Coss, NP  cyclobenzaprine (FLEXERIL) 10 MG tablet Take 1 tablet (10 mg total) by mouth 3 (three) times daily as needed for muscle spasms. 11/14/19  Yes Maximiano Coss, NP  meloxicam (MOBIC) 15 MG tablet TAKE 1 TABLET BY MOUTH EVERY DAY 12/12/19  Yes Maximiano Coss, NP     Positive ROS: Otherwise negative  All other systems have been reviewed and were otherwise negative with the exception of those mentioned in the HPI and as above.  Physical Exam: Constitutional: Alert, well-appearing,  no acute distress Ears: External ears without lesions or tenderness. Ear canals are clear bilaterally with intact, clear TMs.  Nasal: External nose without lesions. Septum with mild deformity.. Clear nasal passages.  She has a very prominent early dorsal hump.  She has a slight scar over the nasal dorsum where she cut the bridge of her nose when the equipment landed on her nose.  This is slightly erythematous but not prominent.  But this appears to be healing satisfactorily. Oral: Lips and gums without lesions. Tongue and palate mucosa without lesions. Posterior oropharynx clear. Neck: No palpable  adenopathy or masses Respiratory: Breathing comfortably  Skin: No facial/neck lesions or rash noted.  Procedures  Assessment: Nasal fracture has appeared to heal satisfactorily although she has slight deformity. Prominent nasal dorsal hump  Plan: Briefly discussed with Therese concerning options of surgical intervention in order to improve the cosmesis of the nose which would involve removing the prominent nasal dorsal hump. Functionally she is doing reasonably well and would not expect any long-term functional problems. She will contact us if she decides to have any surgical intervention.  Narda Bonds, MD

## 2019-12-13 NOTE — Therapy (Signed)
Kelsey Ewing [address] [place], [place], [address] Phone: [phone]   Fax:  [phone]  Physical Therapy Evaluation  Patient Details  Name: Kelsey Ewing MRN: [id number] Date of Birth: September 01, 1986 Referring Provider (PT): Maximiano Coss   Encounter Date: 12/13/2019  PT End of Session - 12/13/19 1110    Visit Number  1    Number of Visits  12    PT Start Time  2426    PT Stop Time  1145    PT Time Calculation (min)  40 min    Activity Tolerance  Patient tolerated treatment well    Behavior During Therapy  [facility] for tasks assessed/performed       Past Medical History:  Diagnosis Date  . Anxiety   . Depression   . Neurocardiogenic syncope 2015    Past Surgical History:  Procedure Laterality Date  . PACEMAKER IMPLANT  2016    There were no vitals filed for this visit.   Subjective Assessment - 12/13/19 1107    Subjective  Pt states initial pain in R side of neck and into R UE, Now also has pain in L side of Neck, into L UE at times. Rids horses, may have stumbled.  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 states pain has improved since onset, but still feels tight and bothersome. Notes UE pain at night, will monitor more closely.    Limitations  Lifting;House hold activities    Patient Stated Goals  decreased pain, increase stretching for neck/back.    Currently in Pain?  Yes    Pain Score  2     Pain Location  Neck    Pain Orientation  Right;Left    Pain Descriptors / Indicators  Aching;Tightness    Pain Type  Acute pain    Pain Onset  More than a month ago    Pain Frequency  Intermittent    Aggravating Factors   Tightness in AM, increased activity , work duties.         [facility] PT Assessment - 12/13/19 0001      Assessment   Medical Diagnosis  Trapezius strain    Referring Provider (PT)  Maximiano Coss    Prior Therapy  no      Balance Screen   Has the patient fallen in the past 6 months  No      Prior  Function   Level of Independence  Independent      Cognition   Overall Cognitive Status  Within Functional Limits for tasks assessed      Posture/Postural Control   Posture Comments  Slight rounded shoulders/flexible posture       ROM / Strength   AROM / PROM / Strength  AROM;Strength      AROM   Overall AROM Comments  Cervical: mild limitation for L rotation and pain with flexion/extension;  UE: WNL      Strength   Overall Strength Comments  Shoulders: 4+/5, Scapular: 4-/5 ;       Palpation   Palpation comment  Tightness and soreness in bil UTs; Mild soreness in lower/mid c-spine with PA mobs;       Special Tests   Other special tests  Neg ULTT,                 Objective measurements completed on examination: See above findings.      [facility] Adult PT Treatment/Exercise - 12/13/19 0001      Exercises  Exercises  Neck      Neck Exercises: Seated   Shoulder Rolls  20 reps    Other Seated Exercise  Scap retract x10 ;       Manual Therapy   Manual Therapy  Soft tissue mobilization;Manual Traction    Manual therapy comments  skilled palpation and monitoring of soft tissue with dry needling.       Neck Exercises: Stretches   Upper Trapezius Stretch  30 seconds;2 reps;Right;Left    Corner Stretch  3 reps;30 seconds    Corner Stretch Limitations  45 and 90 deg    Other Neck Stretches  Supine pec stretch with nerve glide x20 bil;        Trigger Point Dry Needling - 12/13/19 0001    Consent Given?  Yes    Education Handout Provided  Yes    Muscles Treated Head and Neck  Upper trapezius    Upper Trapezius Response  Twitch reponse elicited;Palpable increased muscle length   bilateral          PT Education - 12/13/19 1249    Education Details  PT POC, initial HEP, DN use, benefits, risks,    Person(s) Educated  Patient    Methods  Explanation;Demonstration;Verbal cues;Handout    Comprehension  Verbalized understanding;Returned demonstration;Need further  instruction;Verbal cues required       PT Short Term Goals - 12/13/19 1419      PT SHORT TERM GOAL #1   Title  Pt to be independent with initial HEP    Time  2    Period  Weeks    Status  New    Target Date  12/27/19      PT SHORT TERM GOAL #2   Title  Pt to report decreased pain in bil shoulders, neck to 0-2/10    Time  2    Period  Weeks    Status  New    Target Date  12/27/19        PT Long Term Goals - 12/13/19 1420      PT LONG TERM GOAL #1   Title  Pt to be independent with final HEP    Time  6    Period  Weeks    Status  New    Target Date  01/24/20      PT LONG TERM GOAL #2   Title  Pt to report decreased pain in neck/ UEs to 0-1/10 with activity and sleeping    Time  6    Period  Weeks    Status  New    Target Date  01/24/20      PT LONG TERM GOAL #3   Title  Pt to demo soft tissue restrictions in neck/back to be WNL to decrease pain    Time  6    Period  Weeks    Status  New    Target Date  01/24/20      PT LONG TERM GOAL #4   Title  Pt to demo increased strength of postural muscles to be at least 4+/5 to improve stability and pain .    Time  6    Period  Weeks    Status  New    Target Date  01/24/20             Plan - 12/13/19 1251    Clinical Impression Statement  Pt presetns wtih primary complaint of increased pain in bil UT, neck and into UEs. Pt with  tightness in anterior musculature, as well as UTs. She has decreased seated posture and posture for work duties. Pt with decreased strength of scapular muscles as well. Pt with lack of effective HEP for diagnosis. Pt to benefit from skilled PT to improve deficits and pain.    Examination-Activity Limitations  Reach Overhead;Lift    Examination-Participation Restrictions  Cleaning;Community Activity;[facility]    Stability/Clinical Decision Making  Stable/Uncomplicated    Clinical Decision Making  Low    Rehab Potential  Good    PT Frequency  2x / week    PT Duration  6 weeks     PT Treatment/Interventions  ADLs/Self Care Home Management;Cryotherapy;Electrical Stimulation;Ultrasound;Traction;Moist Heat;Iontophoresis 4mg /ml Dexamethasone;Functional mobility training;Therapeutic activities;Therapeutic exercise;Neuromuscular re-education;Manual techniques;Patient/family education;Passive range of motion;Dry needling;Joint Manipulations;Spinal Manipulations;Taping    Consulted and Agree with Plan of Care  Patient       Patient will benefit from skilled therapeutic intervention in order to improve the following deficits and impairments:  Decreased coordination, Decreased range of motion, Impaired UE functional use, Increased muscle spasms, Decreased activity tolerance, Pain, Impaired flexibility, Improper body mechanics, Decreased mobility, Decreased strength  Visit Diagnosis: Cervicalgia  Other muscle spasm     Problem List Patient Active Problem List   Diagnosis Date Noted  . Sick sinus syndrome (HCC) 11/16/2019  . Anxiety and depression 11/01/2019  . Neurocardiogenic syncope 11/01/2019  . Pacemaker 11/01/2019    11/03/2019, PT, DPT 2:24 PM  12/13/19    [facility] [address] [place], [place], [place] Phone: [phone]   Fax:  [phone]  Name: Zuria Fosdick MRN: Cyril Mourning Date of Birth: Oct 11, 1986

## 2019-12-20 ENCOUNTER — Ambulatory Visit: Payer: 59 | Admitting: Psychology

## 2019-12-21 ENCOUNTER — Ambulatory Visit (INDEPENDENT_AMBULATORY_CARE_PROVIDER_SITE_OTHER): Payer: 59 | Admitting: Physical Therapy

## 2019-12-21 ENCOUNTER — Encounter: Payer: Self-pay | Admitting: Physical Therapy

## 2019-12-21 ENCOUNTER — Other Ambulatory Visit: Payer: Self-pay

## 2019-12-21 DIAGNOSIS — M542 Cervicalgia: Secondary | ICD-10-CM

## 2019-12-21 DIAGNOSIS — M62838 Other muscle spasm: Secondary | ICD-10-CM | POA: Diagnosis not present

## 2019-12-21 NOTE — Therapy (Signed)
[facility] [address] [place], [place], [address] Phone: [phone]   Fax:  [phone]  Physical Therapy Treatment  Patient Details  Name: Kelsey Ewing MRN: [id number] Date of Birth: 24-Jan-1986 Referring Provider (PT): Janeece Agee   Encounter Date: 12/21/2019  PT End of Session - 12/21/19 0916    Visit Number  2    Number of Visits  12    PT Start Time  0817    PT Stop Time  0900    PT Time Calculation (min)  43 min    Activity Tolerance  Patient tolerated treatment well    Behavior During Therapy  [facility] for tasks assessed/performed       Past Medical History:  Diagnosis Date  . Anxiety   . Depression   . Neurocardiogenic syncope 2015    Past Surgical History:  Procedure Laterality Date  . PACEMAKER IMPLANT  2016    There were no vitals filed for this visit.  Subjective Assessment - 12/21/19 0915    Subjective  Pt states decreased soreness and UE tingling since last visit. Felt dry needling was helpful.    Currently in Pain?  Yes    Pain Score  2     Pain Location  Neck    Pain Orientation  Right;Left    Pain Descriptors / Indicators  Tightness;Aching    Pain Type  Acute pain    Pain Onset  More than a month ago    Pain Frequency  Intermittent                       [facility] Adult PT Treatment/Exercise - 12/21/19 0818      Posture/Postural Control   Posture Comments  Slight rounded shoulders/flexible posture       Exercises   Exercises  Neck      Neck Exercises: Theraband   Shoulder Extension  20 reps    Shoulder Extension Limitations  YTB    Rows  Green;20 reps    Shoulder External Rotation  Red;20 reps      Neck Exercises: Seated   Shoulder Rolls  --    Other Seated Exercise  --      Manual Therapy   Manual Therapy  Soft tissue mobilization;Manual Traction    Manual therapy comments  skilled palpation and monitoring of soft tissue with dry needling.     Soft tissue mobilization   DTM to bil UT     Manual Traction  10 sec x10 cervical       Neck Exercises: Stretches   Upper Trapezius Stretch  30 seconds;2 reps;Right;Left    Levator Stretch  2 reps;30 seconds;Right;Left    Corner Stretch  3 reps;30 seconds    Corner Stretch Limitations  45 and 90 deg    Other Neck Stretches  Supine pec stretch with nerve glide x20 bil;        Trigger Point Dry Needling - 12/21/19 0001    Consent Given?  Yes    Education Handout Provided  Previously provided    Muscles Treated Head and Neck  Upper trapezius    Muscles Treated Upper Quadrant  Longissimus    Upper Trapezius Response  Twitch reponse elicited;Palpable increased muscle length   L and R   Longissimus Response  Palpable increased muscle length   L thoracic           PT Education - 12/21/19 0916    Education Details  Reviewed  HEP    Person(s) Educated  Patient    Methods  Explanation;Demonstration;Verbal cues    Comprehension  Verbalized understanding;Returned demonstration       PT Short Term Goals - 12/13/19 1419      PT SHORT TERM GOAL #1   Title  Pt to be independent with initial HEP    Time  2    Period  Weeks    Status  New    Target Date  12/27/19      PT SHORT TERM GOAL #2   Title  Pt to report decreased pain in bil shoulders, neck to 0-2/10    Time  2    Period  Weeks    Status  New    Target Date  12/27/19        PT Long Term Goals - 12/13/19 1420      PT LONG TERM GOAL #1   Title  Pt to be independent with final HEP    Time  6    Period  Weeks    Status  New    Target Date  01/24/20      PT LONG TERM GOAL #2   Title  Pt to report decreased pain in neck/ UEs to 0-1/10 with activity and sleeping    Time  6    Period  Weeks    Status  New    Target Date  01/24/20      PT LONG TERM GOAL #3   Title  Pt to demo soft tissue restrictions in neck/back to be WNL to decrease pain    Time  6    Period  Weeks    Status  New    Target Date  01/24/20      PT LONG TERM GOAL #4    Title  Pt to demo increased strength of postural muscles to be at least 4+/5 to improve stability and pain .    Time  6    Period  Weeks    Status  New    Target Date  01/24/20            Plan - 12/21/19 0917    Clinical Impression Statement  Dry needling done today for muscle tightness and pain, (+ response last visit). THer ex done for stretching and postural strengthening. Pt showing good progress, will benefit from continued care.    Examination-Activity Limitations  Reach Overhead;Lift    Examination-Participation Restrictions  Cleaning;Community Activity;[facility]    Stability/Clinical Decision Making  Stable/Uncomplicated    Rehab Potential  Good    PT Frequency  2x / week    PT Duration  6 weeks    PT Treatment/Interventions  ADLs/Self Care Home Management;Cryotherapy;Electrical Stimulation;Ultrasound;Traction;Moist Heat;Iontophoresis 4mg /ml Dexamethasone;Functional mobility training;Therapeutic activities;Therapeutic exercise;Neuromuscular re-education;Manual techniques;Patient/family education;Passive range of motion;Dry needling;Joint Manipulations;Spinal Manipulations;Taping    Consulted and Agree with Plan of Care  Patient       Patient will benefit from skilled therapeutic intervention in order to improve the following deficits and impairments:  Decreased coordination, Decreased range of motion, Impaired UE functional use, Increased muscle spasms, Decreased activity tolerance, Pain, Impaired flexibility, Improper body mechanics, Decreased mobility, Decreased strength  Visit Diagnosis: Cervicalgia  Other muscle spasm     Problem List Patient Active Problem List   Diagnosis Date Noted  . Sick sinus syndrome (HCC) 11/16/2019  . Anxiety and depression 11/01/2019  . Neurocardiogenic syncope 11/01/2019  . Pacemaker 11/01/2019   11/03/2019, PT, DPT 9:18 AM  12/21/19    [facility] [address]  [place], [place], [address] Phone: [phone]   Fax:  [phone]  Name: Kelsey Ewing MRN: [id number] Date of Birth: May 07, 1986

## 2019-12-29 ENCOUNTER — Encounter: Payer: Self-pay | Admitting: Physical Therapy

## 2019-12-29 ENCOUNTER — Other Ambulatory Visit: Payer: Self-pay

## 2019-12-29 ENCOUNTER — Ambulatory Visit (INDEPENDENT_AMBULATORY_CARE_PROVIDER_SITE_OTHER): Payer: 59 | Admitting: Physical Therapy

## 2019-12-29 DIAGNOSIS — M62838 Other muscle spasm: Secondary | ICD-10-CM

## 2019-12-29 DIAGNOSIS — M542 Cervicalgia: Secondary | ICD-10-CM

## 2019-12-29 NOTE — Patient Instructions (Signed)
Access Code: [email or web address]  URL: [email or web address]  Date: 12/29/2019  Prepared by: Sedalia Muta   Exercises Supine Single Arm Pectoralis Stretch - 3 reps - 30 hold - 2x daily Doorway Pec Stretch at 60 Elevation - 3 reps - 30 hold - 2x daily Seated Scapular Retraction - 10 reps - 1 sets - 3x daily Seated Upper Trapezius Stretch - 3 reps - 30 hold - 2x daily Standing Backward Shoulder Rolls - 10 reps - 1 sets - 3x daily Scapular Retraction with Resistance - 10 reps - 2 sets - 1x daily Shoulder External Rotation and Scapular Retraction with Resistance - 10 reps - 2 sets - 1x daily Standing Shoulder Horizontal Abduction with Resistance - 10 reps - 2 sets - 1x daily Supine Shoulder Flexion Extension AAROM with Dowel - 10 reps - 1-2 sets - 1x daily

## 2019-12-29 NOTE — Therapy (Addendum)
[facility] [address] [place], [place], [address] Phone: [phone]   Fax:  [phone]  Physical Therapy Treatment  Patient Details  Name: Kelsey Ewing MRN: [id number] Date of Birth: [birth year] Referring Provider (PT): Maximiano Coss   Encounter Date: 12/29/2019  PT End of Session - 12/29/19 2130    Visit Number  3    Number of Visits  12    PT Start Time  3976    PT Stop Time  1430    PT Time Calculation (min)  43 min    Activity Tolerance  Patient tolerated treatment well    Behavior During Therapy  [facility] for tasks assessed/performed       Past Medical History:  Diagnosis Date  . Anxiety   . Depression   . Neurocardiogenic syncope 2015    Past Surgical History:  Procedure Laterality Date  . PACEMAKER IMPLANT  2016    There were no vitals filed for this visit.  Subjective Assessment - 12/29/19 2129    Subjective  Pt states improved soreness in upper traps, no tingling in UEs. She does have pain in R side of neck with R SB for L UT stretch.    Currently in Pain?  Yes    Pain Score  2     Pain Location  Neck    Pain Orientation  Right;Left    Pain Descriptors / Indicators  Tightness;Aching    Pain Type  Acute pain    Pain Onset  More than a month ago    Pain Frequency  Intermittent                       [facility] Adult PT Treatment/Exercise - 12/29/19 1347      Posture/Postural Control   Posture Comments  Slight rounded shoulders/flexible posture       Exercises   Exercises  Neck      Neck Exercises: Theraband   Shoulder Extension  20 reps    Shoulder Extension Limitations  YTB    Rows  Green;20 reps    Shoulder External Rotation  Red;20 reps    Horizontal ABduction  Red;20 reps      Neck Exercises: Standing   Other Standing Exercises  Shoulder flexion with posture cues x15; Abd 2lb to 90 deg x15;       Neck Exercises: Supine   Other Supine Exercise  Shoulder flexion/cane 2lb x15;        Manual Therapy   Manual Therapy  Soft tissue mobilization;Manual Traction;Joint mobilization    Manual therapy comments  skilled palpation and monitoring of soft tissue with dry needling.     Joint Mobilization  PA mobs cervical and thoracic, CT juntion mobs on R,  Side glides on R;     Soft tissue mobilization  --    Manual Traction  --      Neck Exercises: Stretches   Upper Trapezius Stretch  30 seconds;2 reps;Right;Left    Upper Trapezius Stretch Limitations  Pain on R    Levator Stretch  2 reps;30 seconds;Right;Left    Corner Stretch  3 reps;30 seconds    Corner Stretch Limitations  45 and 90 deg    Other Neck Stretches  Supine pec stretch with nerve glide x20 bil;        Trigger Point Dry Needling - 12/29/19 0001    Consent Given?  Yes    Education Handout Provided  Previously provided  Muscles Treated Head and Neck  Upper trapezius;Levator scapulae    Upper Trapezius Response  Twitch reponse elicited;Palpable increased muscle length   R   Levator Scapulae Response  Palpable increased muscle length   R          PT Education - 12/29/19 2130    Education Details  HEP updated    Person(s) Educated  Patient    Methods  Explanation;Demonstration;Tactile cues;Verbal cues;Handout    Comprehension  Verbalized understanding;Returned demonstration;Verbal cues required;Need further instruction       PT Short Term Goals - 12/29/19 2131      PT SHORT TERM GOAL #1   Title  Pt to be independent with initial HEP    Time  2    Period  Weeks    Status  Achieved    Target Date  12/27/19      PT SHORT TERM GOAL #2   Title  Pt to report decreased pain in bil shoulders, neck to 0-2/10    Time  2    Period  Weeks    Status  Achieved    Target Date  12/27/19        PT Long Term Goals - 12/13/19 1420      PT LONG TERM GOAL #1   Title  Pt to be independent with final HEP    Time  6    Period  Weeks    Status  New    Target Date  01/24/20      PT LONG TERM GOAL #2    Title  Pt to report decreased pain in neck/ UEs to 0-1/10 with activity and sleeping    Time  6    Period  Weeks    Status  New    Target Date  01/24/20      PT LONG TERM GOAL #3   Title  Pt to demo soft tissue restrictions in neck/back to be WNL to decrease pain    Time  6    Period  Weeks    Status  New    Target Date  01/24/20      PT LONG TERM GOAL #4   Title  Pt to demo increased strength of postural muscles to be at least 4+/5 to improve stability and pain .    Time  6    Period  Weeks    Status  New    Target Date  01/24/20            Plan - 12/29/19 2132    Clinical Impression Statement  Pt with improving soreness in upper trap region, as well as improved tingling in UEs. She has mild limitation noted today with R shoulder flexion/end range. Sub scap release and stretching helped some to improve. HEP updated for UE strengthenig. She has pain in R mid/lower cervical region today with R SB. Maual and DN done to address.    Examination-Activity Limitations  Reach Overhead;Lift    Examination-Participation Restrictions  Cleaning;Community Activity;[facility]    Stability/Clinical Decision Making  Stable/Uncomplicated    Rehab Potential  Good    PT Frequency  2x / week    PT Duration  6 weeks    PT Treatment/Interventions  ADLs/Self Care Home Management;Cryotherapy;Electrical Stimulation;Ultrasound;Traction;Moist Heat;Iontophoresis '4mg'$ /ml Dexamethasone;Functional mobility training;Therapeutic activities;Therapeutic exercise;Neuromuscular re-education;Manual techniques;Patient/family education;Passive range of motion;Dry needling;Joint Manipulations;Spinal Manipulations;Taping    Consulted and Agree with Plan of Care  Patient       Patient will benefit from skilled  therapeutic intervention in order to improve the following deficits and impairments:  Decreased coordination, Decreased range of motion, Impaired UE functional use, Increased muscle spasms, Decreased  activity tolerance, Pain, Impaired flexibility, Improper body mechanics, Decreased mobility, Decreased strength  Visit Diagnosis: Cervicalgia  Other muscle spasm     Problem List Patient Active Problem List   Diagnosis Date Noted  . Sick sinus syndrome ([place]) 11/16/2019  . Anxiety and depression 11/01/2019  . Neurocardiogenic syncope 11/01/2019  . Pacemaker 11/01/2019    Lyndee Hensen, PT, DPT 9:34 PM  12/29/19    [facility] [place], [place], [address] Phone: [phone]   Fax:  [phone]  Name: Kelsey Ewing MRN: [id number] Date of Birth: 1986/11/04   PHYSICAL THERAPY DISCHARGE SUMMARY  Visits from Start of Care:3 Plan: Patient agrees to discharge.  Patient goals were partially met. Patient is being discharged due to not returning since the last visit.  ?????    Lyndee Hensen, PT, DPT 2:41 PM  12/19/20

## 2020-01-02 ENCOUNTER — Encounter: Payer: 59 | Admitting: Physical Therapy

## 2020-01-03 ENCOUNTER — Ambulatory Visit (INDEPENDENT_AMBULATORY_CARE_PROVIDER_SITE_OTHER): Payer: 59 | Admitting: Psychology

## 2020-01-03 DIAGNOSIS — F321 Major depressive disorder, single episode, moderate: Secondary | ICD-10-CM

## 2020-01-12 ENCOUNTER — Ambulatory Visit (INDEPENDENT_AMBULATORY_CARE_PROVIDER_SITE_OTHER): Payer: 59 | Admitting: Psychology

## 2020-01-12 DIAGNOSIS — F411 Generalized anxiety disorder: Secondary | ICD-10-CM

## 2020-01-17 ENCOUNTER — Ambulatory Visit (INDEPENDENT_AMBULATORY_CARE_PROVIDER_SITE_OTHER): Payer: 59 | Admitting: Psychology

## 2020-01-17 DIAGNOSIS — F411 Generalized anxiety disorder: Secondary | ICD-10-CM | POA: Diagnosis not present

## 2020-01-26 ENCOUNTER — Encounter: Payer: Self-pay | Admitting: *Deleted

## 2020-01-30 ENCOUNTER — Ambulatory Visit: Payer: 59 | Admitting: Psychology

## 2020-02-07 ENCOUNTER — Ambulatory Visit (INDEPENDENT_AMBULATORY_CARE_PROVIDER_SITE_OTHER): Payer: 59 | Admitting: Psychology

## 2020-02-07 DIAGNOSIS — F411 Generalized anxiety disorder: Secondary | ICD-10-CM

## 2020-02-13 NOTE — Telephone Encounter (Signed)
Spoke with patient. She requests a call back around 4:45pm when she returns home this afternoon.

## 2020-02-13 NOTE — Telephone Encounter (Signed)
Spoke with patient. Carelink monitor SN updated in system. Pt aware of next transmission date of 02/27/20. Transmission instructions provided. Pt denies any additional concerns at this time.

## 2020-02-23 ENCOUNTER — Ambulatory Visit (INDEPENDENT_AMBULATORY_CARE_PROVIDER_SITE_OTHER): Payer: 59 | Admitting: Psychology

## 2020-02-23 DIAGNOSIS — F411 Generalized anxiety disorder: Secondary | ICD-10-CM

## 2020-02-27 ENCOUNTER — Ambulatory Visit (INDEPENDENT_AMBULATORY_CARE_PROVIDER_SITE_OTHER): Payer: 59 | Admitting: *Deleted

## 2020-02-27 DIAGNOSIS — Z950 Presence of cardiac pacemaker: Secondary | ICD-10-CM | POA: Diagnosis not present

## 2020-02-28 LAB — CUP PACEART REMOTE DEVICE CHECK
Battery Remaining Longevity: 107 mo
Battery Voltage: 3.02 V
Brady Statistic RV Percent Paced: 0.13 %
Date Time Interrogation Session: 20210323094457
Implantable Lead Implant Date: 20160822
Implantable Lead Location: 753860
Implantable Lead Model: 5076
Implantable Pulse Generator Implant Date: 20160822
Lead Channel Impedance Value: 380 Ohm
Lead Channel Impedance Value: 513 Ohm
Lead Channel Pacing Threshold Amplitude: 0.625 V
Lead Channel Pacing Threshold Pulse Width: 0.4 ms
Lead Channel Sensing Intrinsic Amplitude: 10.625 mV
Lead Channel Sensing Intrinsic Amplitude: 10.625 mV
Lead Channel Setting Pacing Amplitude: 2 V
Lead Channel Setting Pacing Pulse Width: 0.4 ms
Lead Channel Setting Sensing Sensitivity: 2 mV

## 2020-03-08 ENCOUNTER — Ambulatory Visit: Payer: 59 | Admitting: Psychology

## 2020-04-20 ENCOUNTER — Telehealth: Payer: Self-pay | Admitting: Cardiology

## 2020-04-20 NOTE — Telephone Encounter (Signed)
   Pt is calling, saying she noticed yesterday she has this weird cough, she said she felt like its coming from her "heart beat" not from her lungs, she said she cough more when she's laying down. No chest pain or other symptoms   Please call

## 2020-04-20 NOTE — Telephone Encounter (Signed)
Patient reports very sudden cough that doesn't seem to be from allergies and it cause a lump in her throat. She had a hard time explaining it very well but she thinks is coming on from a heart beat. She noticed this yesterday it is happening more so when she is laying down. Very odd description. Has gotten more frequent today. She doesn't report any shortness of breath associated with the pain. But she thinks she is having to breath deeper, she denies pain associated with this as well. Patient advised to monitor and if things get worse or she has chest pain to go to the emergency department. Her concern is her pacemaker lead may not be in place. Dr. Bing Matter informed.

## 2020-04-20 NOTE — Telephone Encounter (Signed)
I spoke to her, she has difficult time to describe what she feels. She is concern that there eis a problem with PPM. I reviewed PPM interrogation from march. She use device only 0.1% of time. Thinking was maybe diaphragmatic stimulation but with such a rare pacing I doubt. She was told to go to ER if that get worst if nt will contact her on Monday and will bring her to the office for check

## 2020-04-23 NOTE — Telephone Encounter (Signed)
Left message for patient to return call.

## 2020-04-25 NOTE — Telephone Encounter (Signed)
Called patient back. She reports she hasn't had a episode since Saturday night. Got her scheduled with Dr. Servando Salina next week. Advised her to call us if she needs anything further or if symptoms return. She verbally understood no further questions.

## 2020-04-25 NOTE — Telephone Encounter (Signed)
Follow up  ° ° °Pt is calling back  °Please call back  °

## 2020-04-25 NOTE — Telephone Encounter (Signed)
Left message for patient to return call.

## 2020-05-02 ENCOUNTER — Ambulatory Visit (INDEPENDENT_AMBULATORY_CARE_PROVIDER_SITE_OTHER): Payer: 59 | Admitting: Cardiology

## 2020-05-02 ENCOUNTER — Other Ambulatory Visit: Payer: Self-pay

## 2020-05-02 ENCOUNTER — Encounter: Payer: Self-pay | Admitting: Cardiology

## 2020-05-02 VITALS — BP 110/72 | HR 79 | Ht 67.25 in | Wt 142.0 lb

## 2020-05-02 DIAGNOSIS — Z950 Presence of cardiac pacemaker: Secondary | ICD-10-CM

## 2020-05-02 DIAGNOSIS — R55 Syncope and collapse: Secondary | ICD-10-CM

## 2020-05-02 DIAGNOSIS — R002 Palpitations: Secondary | ICD-10-CM | POA: Diagnosis not present

## 2020-05-02 DIAGNOSIS — I495 Sick sinus syndrome: Secondary | ICD-10-CM | POA: Diagnosis not present

## 2020-05-02 NOTE — Progress Notes (Signed)
Cardiology Office Note Date:  05/03/2020  Patient ID:  Kelsey Ewing, DOB 20-Aug-1986, MRN [id number] PCP:  Maximiano Coss, NP  Cardiologist:  Dr. Harriet Masson EP: Dr. Curt Bears    Chief Complaint:  palpitations  History of Present Illness: Kelsey Ewing is a 34 y.o. female with history of anxiety, depression, neurocardiogenic syncope.   She comes in today to be seen for Dr. Curt Bears, last (and first) seen by him Dec 2020.  He noted "She has a history of sick sinus syndrome status post [employer] single-chamber pacemaker implanted August 2016, neurocardiogenic syncope, anxiety, and depression.  Her pacemaker is a single-chamber right atrial device.  She moved from [place] to [place] January 2019.  As a teenager, she had multiple seizures and fainting spells.  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  Those improved for short period.  She had similar episodes in college.  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  She wore a Holter monitor that showed a 10-second pause she thus had a pacemaker implant."  More recently she saw Dr. Harriet Masson yesterday with c/o palpitations and cough., also noted lightheaded with long periods of standing.  Symptoms apparently occurred over a week prior, were not on goig, but sought attention for them.  Symptoms were not suspect to be 2/2 to her pacer with essentially no pacing but sent for device evaluation.  Also recommended to monitor her lightheadedness and BP.  EKG was done, not yet in Epic.  Planned for CBC to evaluate for anemia.   She is a Teaching laboratory technician, and actively working, does not formally exercise, but uite active with her dogs, horses and has no exertional incapacities.  She will about 1x/month with a day or so that she feel lightheaded with the need to sit dawn, has not had syncope. No CP, no SOB, DOE.  About a week ago she was laying on the sofa and had a sudden strong beat/thump in her chest that  caused her to cough.  Seemed an isolated thing and did not pay much attention to it.  Happened again a couple days later, again supine.  And again a few days later, again supine, the last made the cough last a few moments though.  She became concerned may be something with her pacre and saw Dr. Harriet Masson yesterday recommended we see her.   Device information MDT single chamber PPM, implanted 07/30/2015 [facility]) Device denotes lead is RV lead, CXR position is RV (though notes report RA), RV lead position is confirmed  Today by EKG as well.  Past Medical History:  Diagnosis Date  . Anxiety   . Depression   . Neurocardiogenic syncope 2015    Past Surgical History:  Procedure Laterality Date  . PACEMAKER IMPLANT  2016    Current Outpatient Medications  Medication Sig Dispense Refill  . busPIRone (BUSPAR) 7.5 MG tablet TAKE 1 TABLET (7.5 MG TOTAL) BY MOUTH 3 (THREE) TIMES DAILY. 270 tablet 1  . etonogestrel-ethinyl estradiol (NUVARING) 0.12-0.015 MG/24HR vaginal ring INSERT 1 VAGINAL RING EVERY MONTH BY VAGINAL ROUTE.    . meloxicam (MOBIC) 15 MG tablet TAKE 1 TABLET BY MOUTH EVERY DAY 30 tablet 0   No current facility-administered medications for this visit.    Allergies:   Patient has no known allergies.   Social History:  The patient  reports that she has never smoked. She has never used smokeless tobacco. She reports current alcohol use. She reports that she  does not use drugs.   Family History:  The patient's family history includes Congestive Heart Failure in her maternal grandfather; Diabetes in her father; Heart disease in her maternal grandfather; Hyperlipidemia in her mother; Hypertension in her father.  ROS:  Please see the history of present illness.  All other systems are reviewed and otherwise negative.   PHYSICAL EXAM:  VS:  BP 100/68   Ht 5' 7.25" (1.708 m)   Wt 138 lb (62.6 kg)   BMI 21.45 kg/m  BMI: Body mass index is 21.45 kg/m. Well nourished, well developed, in no  acute distress  HEENT: normocephalic, atraumatic  Neck: no JVD, carotid bruits or masses Cardiac:  RRR; no significant murmurs, no rubs, or gallops Lungs: CTA b/l, no wheezing, rhonchi or rales  Abd: soft, nontender MS: no deformity or atrophy Ext: no edema  Skin: warm and dry, no rash Neuro:  No gross deficits appreciated Psych: euthymic mood, full affect  PPM site is stable, no tethering or discomfort   EKG:  Done today and reviewed by myself shows  Done with pacing is clearly Ventricular pacing Yesterday is SR , normal.  PPM interrogation done today and reviewed by myself:  Battery and lead measurements are good VS 99.7% Pacing today nearly exactly replicates her symptom including the cough.   Recent Labs: 11/22/2019: ALT 10; BUN 13; Creatinine, Ser 0.72; Potassium 4.5; Sodium 137; TSH 1.440 05/02/2020: Hemoglobin 14.1; Platelets 234  11/22/2019: Chol/HDL Ratio 3.2; Cholesterol, Total 177; HDL 56; LDL Chol Calc (NIH) 110; Triglycerides 55   CrCl cannot be calculated (Patient's most recent lab result is older than the maximum 21 days allowed.).   Wt Readings from Last 3 Encounters:  05/03/20 138 lb (62.6 kg)  05/02/20 142 lb (64.4 kg)  11/28/19 145 lb 12.8 oz (66.1 kg)     Other studies reviewed: Additional studies/records reviewed today include: summarized above  ASSESSMENT AND PLAN:  1. PPM     Intact function     No programming changes made  VVI pacing today reproduces her symptom (nearly exactly) She is programmed VVI 50, only paces 0.3% If she starts having much V pacing, could reduce her to 40 or even 30bpm perhaps She will let us know if she feels any more/becomes recurrent   2. Neurocardiogenic syncope     She suspects her menstrual cycle may be a common factor, discussed this as well the ot months coming will be common times for an increase in her symptoms      Discussed symptom recognition, safety, adequate hydration    Disposition: F/u with Q 3  month remotes.  She sees Dr. Servando Salina in 6 mo, will see her in a year, sooner if needed   Current medicines are reviewed at length with the patient today.  The patient did not have any concerns regarding medicines.   Norma Fredrickson, PA-C 05/03/2020 5:11 PM     [facility] [address] [place] [address] [phone] (office)  [phone] (fax)

## 2020-05-02 NOTE — Patient Instructions (Addendum)
Medication Instructions:  Your physician recommends that you continue on your current medications as directed. Please refer to the Current Medication list given to you today.  *If you need a refill on your cardiac medications before your next appointment, please call your pharmacy*   Lab Work: TODAY:  CBC  If you have labs (blood work) drawn today and your tests are completely normal, you will receive your results only by: Marland Kitchen MyChart Message (if you have MyChart) OR . A paper copy in the mail If you have any lab test that is abnormal or we need to change your treatment, we will call you to review the results.   Testing/Procedures: None ordered   Follow-Up: At [facility], you and your health needs are our priority.  As part of our continuing mission to provide you with exceptional heart care, we have created designated Provider Care Teams.  These Care Teams include your primary Cardiologist (physician) and Advanced Practice Providers (APPs -  Physician Assistants and Nurse Practitioners) who all work together to provide you with the care you need, when you need it.  We recommend signing up for the patient portal called "MyChart".  Sign up information is provided on this After Visit Summary.  MyChart is used to connect with patients for Virtual Visits (Telemedicine).  Patients are able to view lab/test results, encounter notes, upcoming appointments, etc.  Non-urgent messages can be sent to your provider as well.   To learn more about what you can do with MyChart, go to [email or web address].    Your next appointment:   1 day(s) Tomorrow, 5/27, arrive at 10:45 at the office at [address] [place], [place] [address]  3 Months with Dr. Servando Salina in office   The format for your next appointment:    In Person  Provider:   Francis Dowse, PA-C   Other Instructions

## 2020-05-02 NOTE — Progress Notes (Signed)
Cardiology Office Note:    Date:  05/02/2020   ID:  Kelsey Ewing, DOB [birth year], MRN [id number]  PCP:  Maximiano Coss, NP  Cardiologist:  Berniece Salines, DO  Electrophysiologist:  Constance Haw, MD   Referring MD: Maximiano Coss, NP   " I have been feeling intermittent lightheadedness and intermittent heart fluttering sensation"   History of Present Illness:    Kelsey Ewing is a 34 y.o. female with a hx of sick sinus syndrome status status post [employer] pacemaker (patient this is a single lead RA pacemaker) implanted in August 2016, neurocardiogenic shock, and anxiety and depression.  I did see the patient in 11/2019 at that time she should presented to establish cardiac care.  At her initial visit she was doing well.  I recommended that she establish care with our EP team as well as our pacer clinic.  The patient was seen by EP in December as well.  She was then established with our remote monitoring at that time there are no concerns with from her device, based on her interrogation she is only using this device 0.1% of the time.  Today she comes for visit tells me that she has been experiencing intermittent fluttering sensation. Patient notes that she had cough that did not seem to be associated with her allergies.  She felt like it caused a lump in her throat.  The most importantly was the fluttery heartbeat that she felt.  She mentioned that this happened about 2 Saturdays ago.  However also recently she has been feeling some lightheaded sensation when she stands for significant long hours at a time.  The events has not occurred for over a week and a half now but she still is concerned.  She denies any chest pain or any syncope episodes.  Past Medical History:  Diagnosis Date  . Anxiety   . Depression   . Neurocardiogenic syncope 2015    Past Surgical History:  Procedure Laterality Date  . PACEMAKER IMPLANT  2016    Current Medications: Current Meds    Medication Sig  . busPIRone (BUSPAR) 7.5 MG tablet TAKE 1 TABLET (7.5 MG TOTAL) BY MOUTH 3 (THREE) TIMES DAILY.  Marland Kitchen etonogestrel-ethinyl estradiol (NUVARING) 0.12-0.015 MG/24HR vaginal ring INSERT 1 VAGINAL RING EVERY MONTH BY VAGINAL ROUTE.  . meloxicam (MOBIC) 15 MG tablet TAKE 1 TABLET BY MOUTH EVERY DAY     Allergies:   Patient has no known allergies.   Social History   Socioeconomic History  . Marital status: Single    Spouse name: Not on file  . Number of children: 0  . Years of education: Not on file  . Highest education level: Not on file  Occupational History  . Occupation: pharmacist  Tobacco Use  . Smoking status: Never Smoker  . Smokeless tobacco: Never Used  Substance and Sexual Activity  . Alcohol use: Yes    Comment: 0-8 a week  . Drug use: Never  . Sexual activity: Yes  Other Topics Concern  . Not on file  Social History Narrative  . Not on file   Social Determinants of Health   Financial Resource Strain: Low Risk   . Difficulty of Paying Living Expenses: Not hard at all  Food Insecurity: No Food Insecurity  . Worried About Charity fundraiser in the Last Year: Never true  . Ran Out of Food in the Last Year: Never true  Transportation Needs: No Transportation Needs  . Lack of Transportation (Medical): No  .  Lack of Transportation (Non-Medical): No  Physical Activity: Unknown  . Days of Exercise per Week: Patient refused  . Minutes of Exercise per Session: Patient refused  Stress: Stress Concern Present  . Feeling of Stress : To some extent  Social Connections: Unknown  . Frequency of Communication with Friends and Family: Three times a week  . Frequency of Social Gatherings with Friends and Family: Twice a week  . Attends Religious Services: Patient refused  . Active Member of Clubs or Organizations: Patient refused  . Attends Banker Meetings: Patient refused  . Marital Status: Patient refused     Family History: The patient's  family history includes Congestive Heart Failure in her maternal grandfather; Diabetes in her father; Heart disease in her maternal grandfather; Hyperlipidemia in her mother; Hypertension in her father.  ROS:   Review of Systems  Constitution: Negative for decreased appetite, fever and weight gain.  HENT: Negative for congestion, ear discharge, hoarse voice and sore throat.   Eyes: Negative for discharge, redness, vision loss in right eye and visual halos.  Cardiovascular: Negative for chest pain, dyspnea on exertion, leg swelling, orthopnea and palpitations.  Respiratory: Negative for cough, hemoptysis, shortness of breath and snoring.   Endocrine: Negative for heat intolerance and polyphagia.  Hematologic/Lymphatic: Negative for bleeding problem. Does not bruise/bleed easily.  Skin: Negative for flushing, nail changes, rash and suspicious lesions.  Musculoskeletal: Negative for arthritis, joint pain, muscle cramps, myalgias, neck pain and stiffness.  Gastrointestinal: Negative for abdominal pain, bowel incontinence, diarrhea and excessive appetite.  Genitourinary: Negative for decreased libido, genital sores and incomplete emptying.  Neurological: Negative for brief paralysis, focal weakness, headaches and loss of balance.  Psychiatric/Behavioral: Negative for altered mental status, depression and suicidal ideas.  Allergic/Immunologic: Negative for HIV exposure and persistent infections.    EKGs/Labs/Other Studies Reviewed:    The following studies were reviewed today:   EKG:  The ekg ordered today demonstrates sinus rhythm, heart rate 79 bpm.  Recent Labs: 11/22/2019: ALT 10; BUN 13; Creatinine, Ser 0.72; Hemoglobin 13.8; Platelets 191; Potassium 4.5; Sodium 137; TSH 1.440  Recent Lipid Panel    Component Value Date/Time   CHOL 177 11/22/2019 0846   TRIG 55 11/22/2019 0846   HDL 56 11/22/2019 0846   CHOLHDL 3.2 11/22/2019 0846   LDLCALC 110 (H) 11/22/2019 0846    Physical  Exam:    VS:  BP 110/72   Pulse 79   Ht 5' 7.25" (1.708 m)   Wt 142 lb (64.4 kg)   SpO2 99%   BMI 22.08 kg/m     Wt Readings from Last 3 Encounters:  05/02/20 142 lb (64.4 kg)  11/28/19 145 lb 12.8 oz (66.1 kg)  11/16/19 146 lb (66.2 kg)     GEN: Well nourished, well developed in no acute distress HEENT: Normal NECK: No JVD; No carotid bruits LYMPHATICS: No lymphadenopathy CARDIAC: S1S2 noted,RRR, no murmurs, rubs, gallops RESPIRATORY:  Clear to auscultation without rales, wheezing or rhonchi  ABDOMEN: Soft, non-tender, non-distended, +bowel sounds, no guarding. EXTREMITIES: No edema, No cyanosis, no clubbing MUSCULOSKELETAL:  No deformity  SKIN: Warm and dry NEUROLOGIC:  Alert and oriented x 3, non-focal PSYCHIATRIC:  Normal affect, good insight  ASSESSMENT:    1. Pacemaker   2. Cardiac pacemaker in situ   3. Sick sinus syndrome (HCC)   4. Palpitations   5. Near syncope    PLAN:     1.  Her symptoms are not consistent, I am not really  sure that this is related to her pacemaker given that she really does not use her pacemaker and based on her February 28, 2020 interrogation it was only functioning 0.1% of the time.  Despite that I will send the patient to be seen by our pacer clinic for repeat interrogation.  She has been scheduled for 11 AM tomorrow morning.  2.  In the meantime I would like her to continue to monitor her near syncope events.  She thinks that this is occurring around her.  Which may likely be symptomatic anemia.  I also want her to do her blood pressure when this is occurring to make sure that orthostatic hypotension is not a factor here.  I will do CBC today to monitor her blood count.  The patient is in agreement with the above plan. The patient left the office in stable condition.  The patient will follow up in 3 months or sooner if needed.   Medication Adjustments/Labs and Tests Ordered: Current medicines are reviewed at length with the patient  today.  Concerns regarding medicines are outlined above.  Orders Placed This Encounter  Procedures  . CBC  . EKG 12-Lead   No orders of the defined types were placed in this encounter.   Patient Instructions  Medication Instructions:  Your physician recommends that you continue on your current medications as directed. Please refer to the Current Medication list given to you today.  *If you need a refill on your cardiac medications before your next appointment, please call your pharmacy*   Lab Work: TODAY:  CBC  If you have labs (blood work) drawn today and your tests are completely normal, you will receive your results only by: Marland Kitchen MyChart Message (if you have MyChart) OR . A paper copy in the mail If you have any lab test that is abnormal or we need to change your treatment, we will call you to review the results.   Testing/Procedures: None ordered   Follow-Up: At [facility], you and your health needs are our priority.  As part of our continuing mission to provide you with exceptional heart care, we have created designated Provider Care Teams.  These Care Teams include your primary Cardiologist (physician) and Advanced Practice Providers (APPs -  Physician Assistants and Nurse Practitioners) who all work together to provide you with the care you need, when you need it.  We recommend signing up for the patient portal called "MyChart".  Sign up information is provided on this After Visit Summary.  MyChart is used to connect with patients for Virtual Visits (Telemedicine).  Patients are able to view lab/test results, encounter notes, upcoming appointments, etc.  Non-urgent messages can be sent to your provider as well.   To learn more about what you can do with MyChart, go to [email or web address].    Your next appointment:   1 day(s) Tomorrow, 5/27, arrive at 10:45 at the office at [address] [place], [place] [address]  3 Months with Dr. Servando Salina in office   The format for  your next appointment:    In Person  Provider:   Francis Dowse, PA-C   Other Instructions      Adopting a Healthy Lifestyle.  Know what a healthy weight is for you (roughly BMI <25) and aim to maintain this   Aim for 7+ servings of fruits and vegetables daily   65-80+ fluid ounces of water or unsweet tea for healthy kidneys   Limit to max 1 drink of alcohol per  day; avoid smoking/tobacco   Limit animal fats in diet for cholesterol and heart health - choose grass fed whenever available   Avoid highly processed foods, and foods high in saturated/trans fats   Aim for low stress - take time to unwind and care for your mental health   Aim for 150 min of moderate intensity exercise weekly for heart health, and weights twice weekly for bone health   Aim for 7-9 hours of sleep daily   When it comes to diets, agreement about the perfect plan isnt easy to find, even among the experts. Experts at the [facility] [employer] developed an idea known as the Healthy Eating Plate. Just imagine a plate divided into logical, healthy portions.   The emphasis is on diet quality:   Load up on vegetables and fruits - one-half of your plate: Aim for color and variety, and remember that potatoes dont count.   Go for whole grains - one-quarter of your plate: Whole wheat, barley, wheat berries, quinoa, oats, brown rice, and foods made with them. If you want pasta, go with whole wheat pasta.   Protein power - one-quarter of your plate: Fish, chicken, beans, and nuts are all healthy, versatile protein sources. Limit red meat.   The diet, however, does go beyond the plate, offering a few other suggestions.   Use healthy plant oils, such as olive, canola, soy, corn, sunflower and peanut. Check the labels, and avoid partially hydrogenated oil, which have unhealthy trans fats.   If youre thirsty, drink water. Coffee and tea are good in moderation, but skip sugary drinks and limit milk and  dairy products to one or two daily servings.   The type of carbohydrate in the diet is more important than the amount. Some sources of carbohydrates, such as vegetables, fruits, whole grains, and beans-are healthier than others.   Finally, stay active  Signed, Thomasene Ripple, DO  05/02/2020 6:52 PM    [facility]

## 2020-05-03 ENCOUNTER — Ambulatory Visit (INDEPENDENT_AMBULATORY_CARE_PROVIDER_SITE_OTHER): Payer: 59 | Admitting: Physician Assistant

## 2020-05-03 VITALS — BP 100/68 | Ht 67.25 in | Wt 138.0 lb

## 2020-05-03 DIAGNOSIS — R002 Palpitations: Secondary | ICD-10-CM

## 2020-05-03 DIAGNOSIS — I495 Sick sinus syndrome: Secondary | ICD-10-CM

## 2020-05-03 DIAGNOSIS — Z950 Presence of cardiac pacemaker: Secondary | ICD-10-CM

## 2020-05-03 LAB — CBC
Hematocrit: 42.3 % (ref 34.0–46.6)
Hemoglobin: 14.1 g/dL (ref 11.1–15.9)
MCH: 29.7 pg (ref 26.6–33.0)
MCHC: 33.3 g/dL (ref 31.5–35.7)
MCV: 89 fL (ref 79–97)
Platelets: 234 10*3/uL (ref 150–450)
RBC: 4.75 x10E6/uL (ref 3.77–5.28)
RDW: 13.1 % (ref 11.7–15.4)
WBC: 6.8 10*3/uL (ref 3.4–10.8)

## 2020-05-03 NOTE — Patient Instructions (Signed)
Medication Instructions:  ° ° °Your physician recommends that you continue on your current medications as directed. Please refer to the Current Medication list given to you today. ° °*If you need a refill on your cardiac medications before your next appointment, please call your pharmacy* ° ° °Lab Work: NONE ORDERED  TODAY ° ° ° °If you have labs (blood work) drawn today and your tests are completely normal, you will receive your results only by: °• MyChart Message (if you have MyChart) OR °• A paper copy in the mail °If you have any lab test that is abnormal or we need to change your treatment, we will call you to review the results. ° ° °Testing/Procedures:NONE ORDERED  TODAY ° ° ° ° °Follow-Up: °At [facility], you and your health needs are our priority.  As part of our continuing mission to provide you with exceptional heart care, we have created designated Provider Care Teams.  These Care Teams include your primary Cardiologist (physician) and Advanced Practice Providers (APPs -  Physician Assistants and Nurse Practitioners) who all work together to provide you with the care you need, when you need it. ° °We recommend signing up for the patient portal called "MyChart".  Sign up information is provided on this After Visit Summary.  MyChart is used to connect with patients for Virtual Visits (Telemedicine).  Patients are able to view lab/test results, encounter notes, upcoming appointments, etc.  Non-urgent messages can be sent to your provider as well.   °To learn more about what you can do with MyChart, go to [email or web address].   ° °Your next appointment:   °1 year(s) ° °The format for your next appointment:   °In Person ° °Provider:   °You may see Will Martin Camnitz, MD or one of the following Advanced Practice Providers on your designated Care Team:   °· Amber Seiler, NP °· Renee Ursuy, PA-C °· Michael "Andy" Tillery, PA-C ° ° ° °Other Instructions ° ° °

## 2020-05-04 ENCOUNTER — Telehealth: Payer: Self-pay

## 2020-05-04 NOTE — Telephone Encounter (Signed)
Spoke with patient regarding results.  Patient verbalizes understanding and is agreeable to plan of care. Advised patient to call back with any issues or concerns.  

## 2020-05-04 NOTE — Telephone Encounter (Signed)
-----   Message from Thomasene Ripple, DO sent at 05/03/2020 11:34 PM EDT ----- CBC normal

## 2020-05-28 ENCOUNTER — Ambulatory Visit (INDEPENDENT_AMBULATORY_CARE_PROVIDER_SITE_OTHER): Payer: 59 | Admitting: *Deleted

## 2020-05-28 DIAGNOSIS — R55 Syncope and collapse: Secondary | ICD-10-CM

## 2020-05-29 ENCOUNTER — Telehealth: Payer: Self-pay

## 2020-05-29 NOTE — Telephone Encounter (Signed)
Spoke with patient to remind of missed remote transmission 

## 2020-05-30 LAB — CUP PACEART REMOTE DEVICE CHECK
Battery Remaining Longevity: 101 mo
Battery Voltage: 3.02 V
Brady Statistic RV Percent Paced: 0.26 %
Date Time Interrogation Session: 20210622165757
Implantable Lead Implant Date: 20160822
Implantable Lead Location: 753860
Implantable Lead Model: 5076
Implantable Pulse Generator Implant Date: 20160822
Lead Channel Impedance Value: 361 Ohm
Lead Channel Impedance Value: 494 Ohm
Lead Channel Pacing Threshold Amplitude: 0.75 V
Lead Channel Pacing Threshold Pulse Width: 0.4 ms
Lead Channel Sensing Intrinsic Amplitude: 10.25 mV
Lead Channel Sensing Intrinsic Amplitude: 10.25 mV
Lead Channel Setting Pacing Amplitude: 2 V
Lead Channel Setting Pacing Pulse Width: 0.4 ms
Lead Channel Setting Sensing Sensitivity: 2 mV

## 2020-05-30 NOTE — Progress Notes (Signed)
Remote pacemaker transmission.   

## 2020-07-27 ENCOUNTER — Other Ambulatory Visit: Payer: Self-pay

## 2020-07-27 ENCOUNTER — Encounter: Payer: Self-pay | Admitting: Cardiology

## 2020-07-27 ENCOUNTER — Ambulatory Visit (INDEPENDENT_AMBULATORY_CARE_PROVIDER_SITE_OTHER): Payer: 59 | Admitting: Cardiology

## 2020-07-27 VITALS — BP 108/70 | HR 72 | Ht 67.25 in | Wt 144.0 lb

## 2020-07-27 DIAGNOSIS — Z950 Presence of cardiac pacemaker: Secondary | ICD-10-CM

## 2020-07-27 DIAGNOSIS — R55 Syncope and collapse: Secondary | ICD-10-CM | POA: Diagnosis not present

## 2020-07-27 NOTE — Progress Notes (Signed)
Cardiology Office Note:    Date:  07/27/2020   ID:  Kelsey Ewing, DOB Nov 15, 1986, MRN [id number]  PCP:  Janeece Agee, NP  Cardiologist:  Thomasene Ripple, DO  Electrophysiologist:  Regan Lemming, MD   Referring MD: Janeece Agee, NP   Chief Complaint  Patient presents with  . Follow-up    History of Present Illness:    Kelsey Ewing a 34 y.o.femalewith a hx of sick sinus syndrome status status post [employer] pacemaker(patient this is a single lead RA pacemaker)implanted in August 2016, neurocardiogenic shock, and anxiety and depression.  I saw the patient on May 02, 2020 after that time she had had the episode.  During her visit was suspected that this may have been vasovagal but given the fact that the patient had a pacemaker I referred her to pacemaker clinic to get interrogated.  We also did blood work that these reassess her CBC.  She did see the pacemaker clinic and everything turned out to be fine with a normal interrogation.  She is here today for follow-up visit.  She offers no complaints at this time patient has not had any recurrent syncope episodes.   Past Medical History:  Diagnosis Date  . Anxiety   . Depression   . Neurocardiogenic syncope 2015    Past Surgical History:  Procedure Laterality Date  . PACEMAKER IMPLANT  2016    Current Medications: Current Meds  Medication Sig  . busPIRone (BUSPAR) 7.5 MG tablet TAKE 1 TABLET (7.5 MG TOTAL) BY MOUTH 3 (THREE) TIMES DAILY.  Marland Kitchen etonogestrel-ethinyl estradiol (NUVARING) 0.12-0.015 MG/24HR vaginal ring INSERT 1 VAGINAL RING EVERY MONTH BY VAGINAL ROUTE.  . meloxicam (MOBIC) 15 MG tablet TAKE 1 TABLET BY MOUTH EVERY DAY     Allergies:   Patient has no known allergies.   Social History   Socioeconomic History  . Marital status: Single    Spouse name: Not on file  . Number of children: 0  . Years of education: Not on file  . Highest education level: Not on file  Occupational History  .  Occupation: pharmacist  Tobacco Use  . Smoking status: Never Smoker  . Smokeless tobacco: Never Used  Vaping Use  . Vaping Use: Never used  Substance and Sexual Activity  . Alcohol use: Yes    Comment: 0-8 a week  . Drug use: Never  . Sexual activity: Yes  Other Topics Concern  . Not on file  Social History Narrative  . Not on file   Social Determinants of Health   Financial Resource Strain: Low Risk   . Difficulty of Paying Living Expenses: Not hard at all  Food Insecurity: No Food Insecurity  . Worried About Programme researcher, broadcasting/film/video in the Last Year: Never true  . Ran Out of Food in the Last Year: Never true  Transportation Needs: No Transportation Needs  . Lack of Transportation (Medical): No  . Lack of Transportation (Non-Medical): No  Physical Activity: Unknown  . Days of Exercise per Week: Patient refused  . Minutes of Exercise per Session: Patient refused  Stress: Stress Concern Present  . Feeling of Stress : To some extent  Social Connections: Unknown  . Frequency of Communication with Friends and Family: Three times a week  . Frequency of Social Gatherings with Friends and Family: Twice a week  . Attends Religious Services: Patient refused  . Active Member of Clubs or Organizations: Patient refused  . Attends Banker Meetings: Patient refused  .  Marital Status: Patient refused     Family History: The patient's family history includes Congestive Heart Failure in her maternal grandfather; Diabetes in her father; Heart disease in her maternal grandfather; Hyperlipidemia in her mother; Hypertension in her father.  ROS:   Review of Systems  Constitution: Negative for decreased appetite, fever and weight gain.  HENT: Negative for congestion, ear discharge, hoarse voice and sore throat.   Eyes: Negative for discharge, redness, vision loss in right eye and visual halos.  Cardiovascular: Negative for chest pain, dyspnea on exertion, leg swelling, orthopnea and  palpitations.  Respiratory: Negative for cough, hemoptysis, shortness of breath and snoring.   Endocrine: Negative for heat intolerance and polyphagia.  Hematologic/Lymphatic: Negative for bleeding problem. Does not bruise/bleed easily.  Skin: Negative for flushing, nail changes, rash and suspicious lesions.  Musculoskeletal: Negative for arthritis, joint pain, muscle cramps, myalgias, neck pain and stiffness.  Gastrointestinal: Negative for abdominal pain, bowel incontinence, diarrhea and excessive appetite.  Genitourinary: Negative for decreased libido, genital sores and incomplete emptying.  Neurological: Negative for brief paralysis, focal weakness, headaches and loss of balance.  Psychiatric/Behavioral: Negative for altered mental status, depression and suicidal ideas.  Allergic/Immunologic: Negative for HIV exposure and persistent infections.    EKGs/Labs/Other Studies Reviewed:    The following studies were reviewed today:   EKG: None today  Recent Labs: 11/22/2019: ALT 10; BUN 13; Creatinine, Ser 0.72; Potassium 4.5; Sodium 137; TSH 1.440 05/02/2020: Hemoglobin 14.1; Platelets 234  Recent Lipid Panel    Component Value Date/Time   CHOL 177 11/22/2019 0846   TRIG 55 11/22/2019 0846   HDL 56 11/22/2019 0846   CHOLHDL 3.2 11/22/2019 0846   LDLCALC 110 (H) 11/22/2019 0846    Physical Exam:    VS:  BP 108/70 (BP Location: Left Arm, Patient Position: Sitting, Cuff Size: Normal)   Pulse 72   Ht 5' 7.25" (1.708 m)   Wt 144 lb (65.3 kg)   SpO2 98%   BMI 22.39 kg/m     Wt Readings from Last 3 Encounters:  07/27/20 144 lb (65.3 kg)  05/03/20 138 lb (62.6 kg)  05/02/20 142 lb (64.4 kg)     GEN: Well nourished, well developed in no acute distress HEENT: Normal NECK: No JVD; No carotid bruits LYMPHATICS: No lymphadenopathy CARDIAC: S1S2 noted,RRR, no murmurs, rubs, gallops RESPIRATORY:  Clear to auscultation without rales, wheezing or rhonchi  ABDOMEN: Soft,  non-tender, non-distended, +bowel sounds, no guarding. EXTREMITIES: No edema, No cyanosis, no clubbing MUSCULOSKELETAL:  No deformity  SKIN: Warm and dry NEUROLOGIC:  Alert and oriented x 3, non-focal PSYCHIATRIC:  Normal affect, good insight  ASSESSMENT:    1. Near syncope   2. Pacemaker    PLAN:     1.  She appears to be doing well from a cardiovascular standpoint.  She has not had any recurrent syncope.  I am very happy for the patient.  We will continue her current treatment plan. The patient is in agreement with the above plan. The patient left the office in stable condition.  The patient will follow up in 1 year or sooner if needed.   Medication Adjustments/Labs and Tests Ordered: Current medicines are reviewed at length with the patient today.  Concerns regarding medicines are outlined above.  No orders of the defined types were placed in this encounter.  No orders of the defined types were placed in this encounter.   Patient Instructions  Medication Instructions:  No medication changes. *If you need a  refill on your cardiac medications before your next appointment, please call your pharmacy*   Lab Work: None ordered If you have labs (blood work) drawn today and your tests are completely normal, you will receive your results only by: Marland Kitchen MyChart Message (if you have MyChart) OR . A paper copy in the mail If you have any lab test that is abnormal or we need to change your treatment, we will call you to review the results.   Testing/Procedures: None ordered   Follow-Up: At [facility], you and your health needs are our priority.  As part of our continuing mission to provide you with exceptional heart care, we have created designated Provider Care Teams.  These Care Teams include your primary Cardiologist (physician) and Advanced Practice Providers (APPs -  Physician Assistants and Nurse Practitioners) who all work together to provide you with the care you need, when  you need it.  We recommend signing up for the patient portal called "MyChart".  Sign up information is provided on this After Visit Summary.  MyChart is used to connect with patients for Virtual Visits (Telemedicine).  Patients are able to view lab/test results, encounter notes, upcoming appointments, etc.  Non-urgent messages can be sent to your provider as well.   To learn more about what you can do with MyChart, go to [email or web address].    Your next appointment:   1 year(s)  The format for your next appointment:   In Person  Provider:   Thomasene Ripple, DO   Other Instructions NA     Adopting a Healthy Lifestyle.  Know what a healthy weight is for you (roughly BMI <25) and aim to maintain this   Aim for 7+ servings of fruits and vegetables daily   65-80+ fluid ounces of water or unsweet tea for healthy kidneys   Limit to max 1 drink of alcohol per day; avoid smoking/tobacco   Limit animal fats in diet for cholesterol and heart health - choose grass fed whenever available   Avoid highly processed foods, and foods high in saturated/trans fats   Aim for low stress - take time to unwind and care for your mental health   Aim for 150 min of moderate intensity exercise weekly for heart health, and weights twice weekly for bone health   Aim for 7-9 hours of sleep daily   When it comes to diets, agreement about the perfect plan isnt easy to find, even among the experts. Experts at the [facility] [employer] developed an idea known as the Healthy Eating Plate. Just imagine a plate divided into logical, healthy portions.   The emphasis is on diet quality:   Load up on vegetables and fruits - one-half of your plate: Aim for color and variety, and remember that potatoes dont count.   Go for whole grains - one-quarter of your plate: Whole wheat, barley, wheat berries, quinoa, oats, brown rice, and foods made with them. If you want pasta, go with whole wheat pasta.     Protein power - one-quarter of your plate: Fish, chicken, beans, and nuts are all healthy, versatile protein sources. Limit red meat.   The diet, however, does go beyond the plate, offering a few other suggestions.   Use healthy plant oils, such as olive, canola, soy, corn, sunflower and peanut. Check the labels, and avoid partially hydrogenated oil, which have unhealthy trans fats.   If youre thirsty, drink water. Coffee and tea are good in moderation, but skip sugary drinks  and limit milk and dairy products to one or two daily servings.   The type of carbohydrate in the diet is more important than the amount. Some sources of carbohydrates, such as vegetables, fruits, whole grains, and beans-are healthier than others.   Finally, stay active  Signed, Thomasene RippleKardie Lelaina Oatis, DO  07/27/2020 1:30 PM    [facility]

## 2020-07-27 NOTE — Patient Instructions (Signed)
Medication Instructions:  No medication changes. *If you need a refill on your cardiac medications before your next appointment, please call your pharmacy*   Lab Work: None ordered If you have labs (blood work) drawn today and your tests are completely normal, you will receive your results only by: . MyChart Message (if you have MyChart) OR . A paper copy in the mail If you have any lab test that is abnormal or we need to change your treatment, we will call you to review the results.   Testing/Procedures: None ordered   Follow-Up: At [facility], you and your health needs are our priority.  As part of our continuing mission to provide you with exceptional heart care, we have created designated Provider Care Teams.  These Care Teams include your primary Cardiologist (physician) and Advanced Practice Providers (APPs -  Physician Assistants and Nurse Practitioners) who all work together to provide you with the care you need, when you need it.  We recommend signing up for the patient portal called "MyChart".  Sign up information is provided on this After Visit Summary.  MyChart is used to connect with patients for Virtual Visits (Telemedicine).  Patients are able to view lab/test results, encounter notes, upcoming appointments, etc.  Non-urgent messages can be sent to your provider as well.   To learn more about what you can do with MyChart, go to [email or web address].    Your next appointment:   1 year(s)  The format for your next appointment:   In Person  Provider:   Kardie Tobb, DO   Other Instructions NA  

## 2020-11-01 IMAGING — DX DG CHEST 2V
2 series · 2 of 2 positions shown · non-contrast
Comparison: None.

CLINICAL DATA: Pacemaker

EXAM:
CHEST - 2 VIEW

[chest pa]
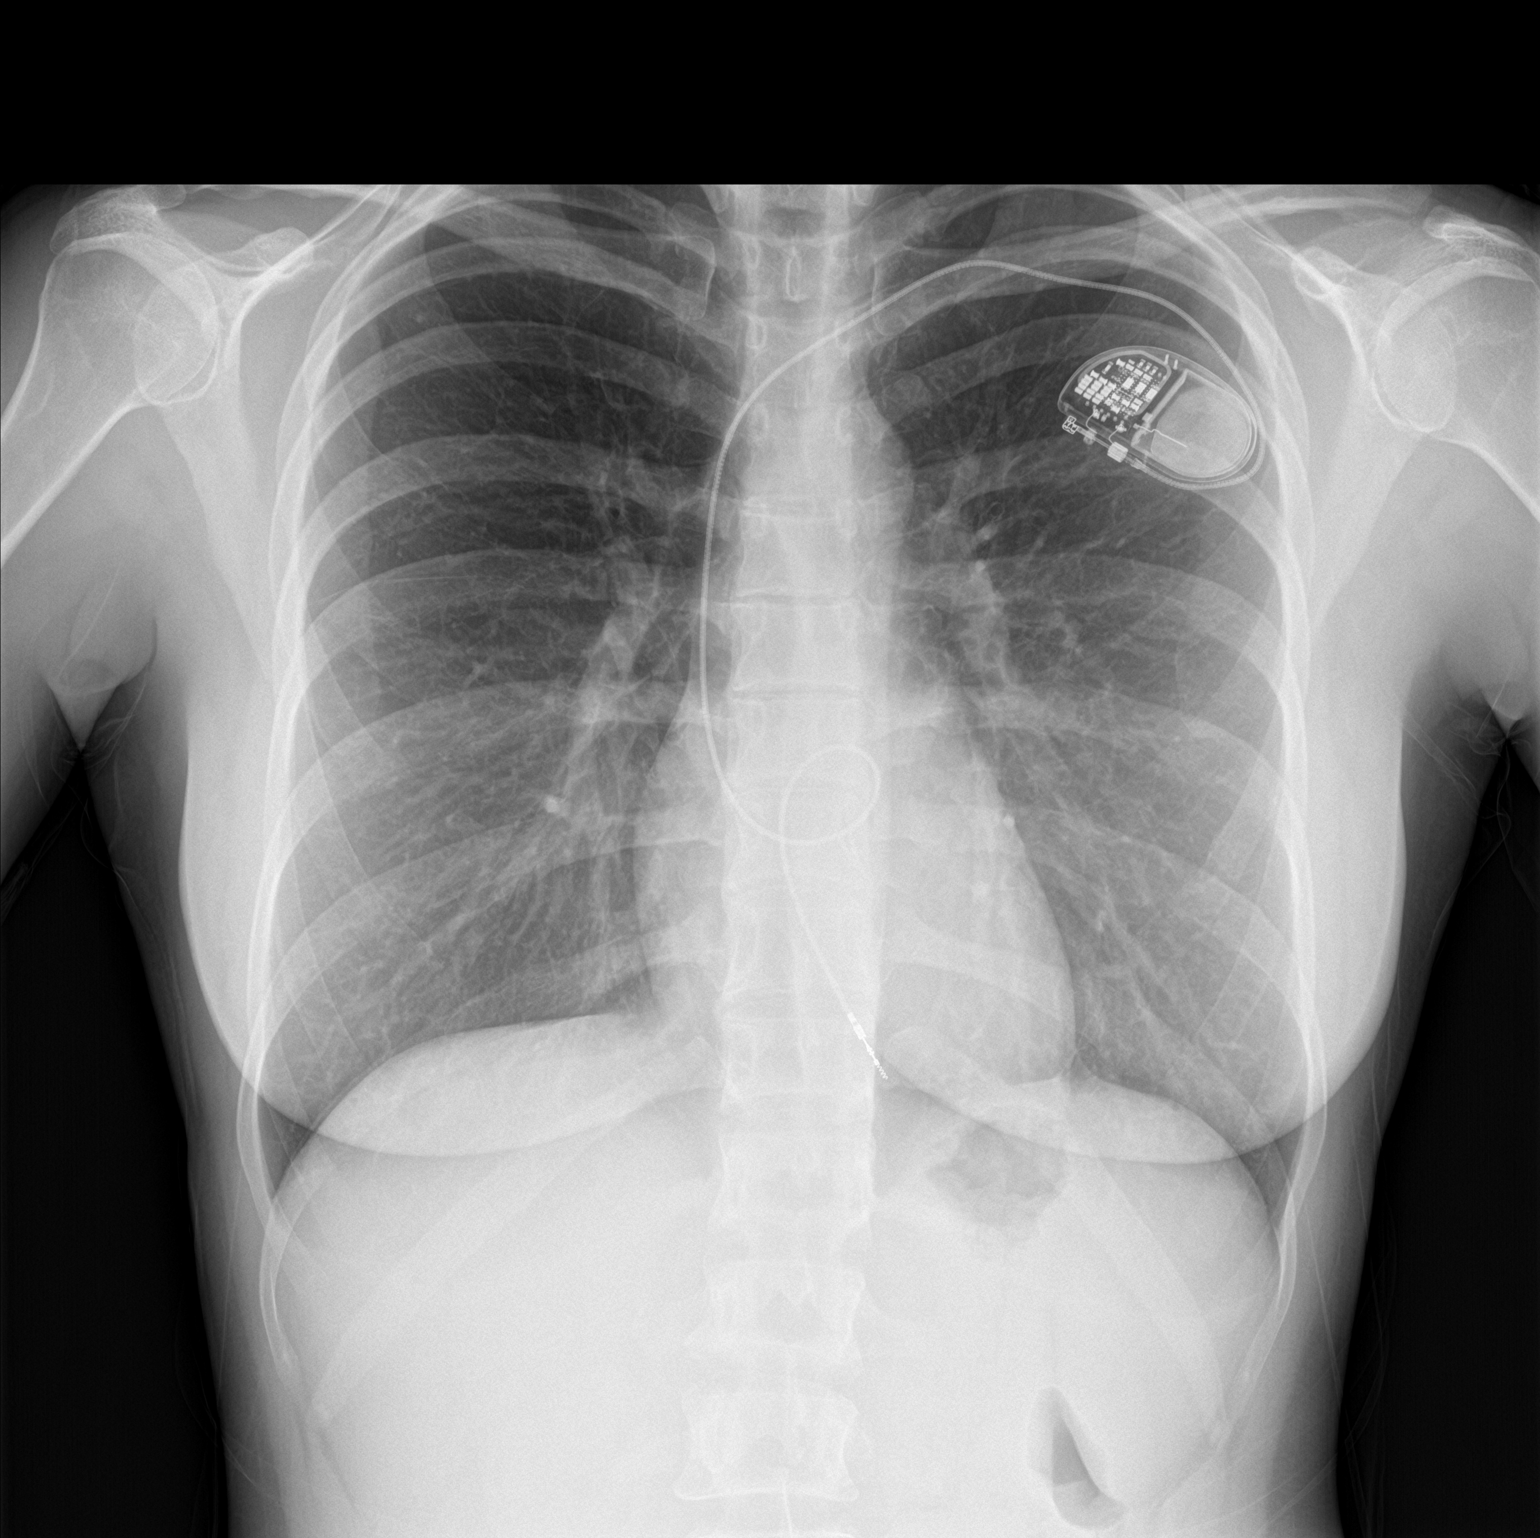

[chest lat]
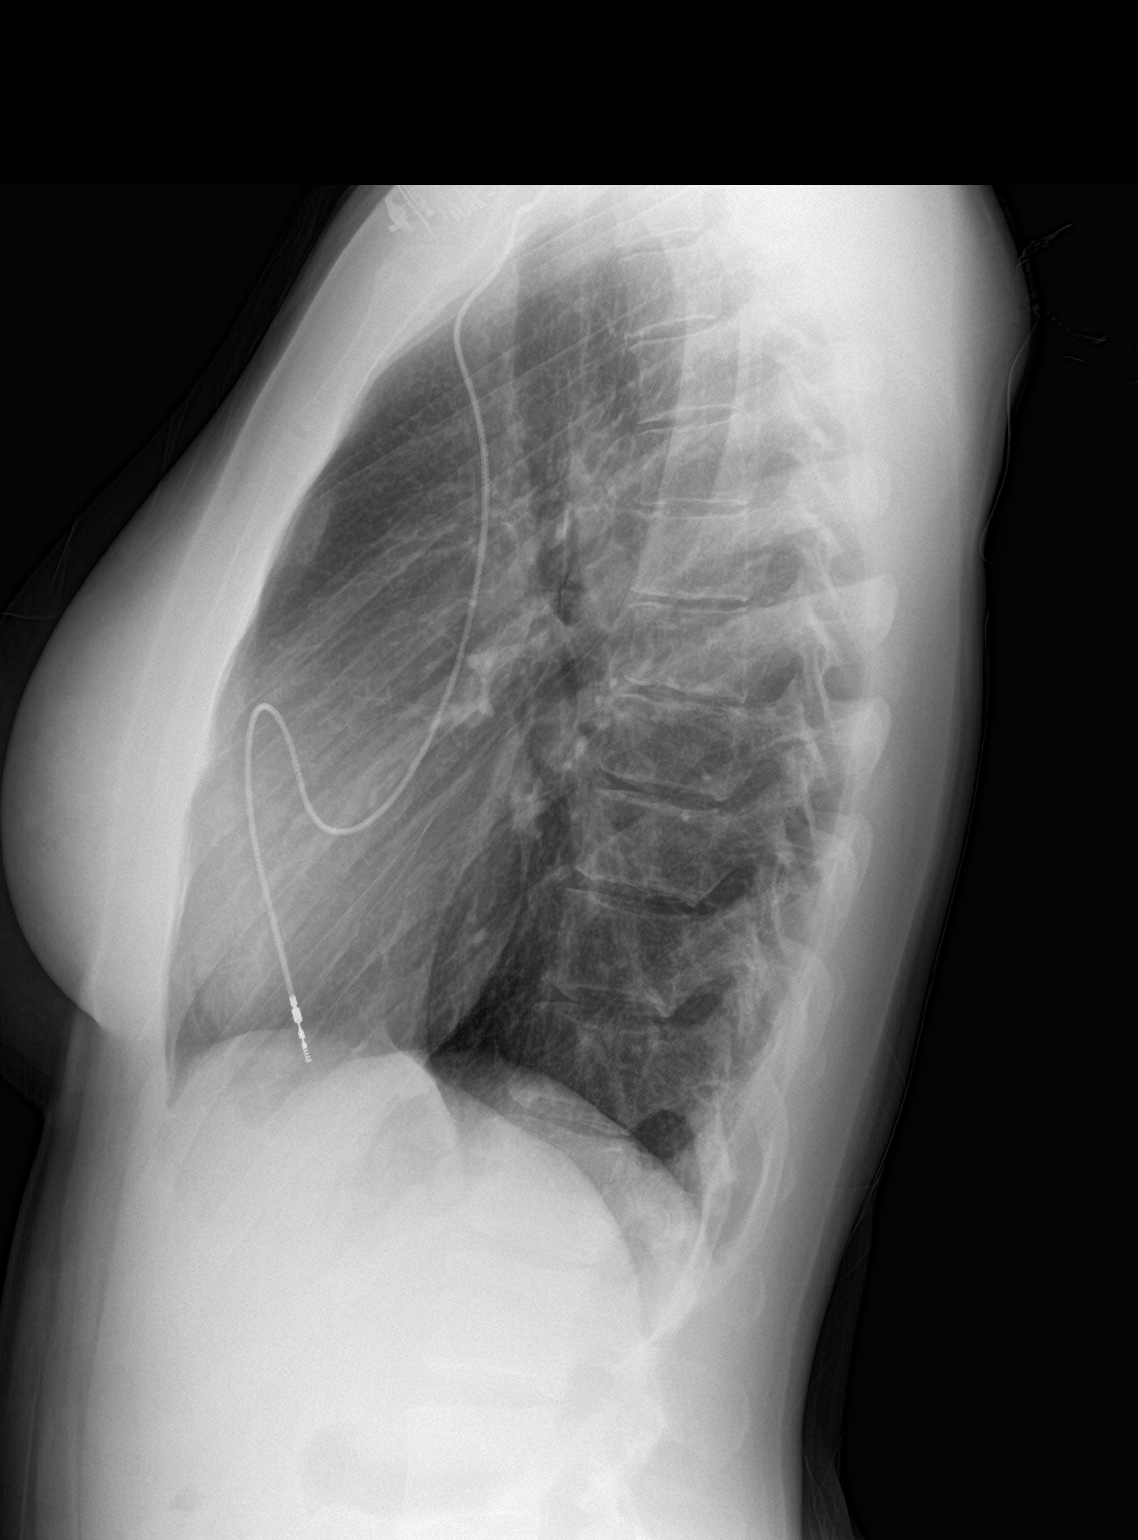

[2 of 2 positions shown; findings below may reference images not displayed]

FINDINGS: The heart size and mediastinal contours are within normal limits.
Left chest single lead pacer. Both lungs are clear. The visualized
skeletal structures are unremarkable.
IMPRESSION: Left chest single lead pacer.  No acute abnormality of the lungs.

## 2021-03-22 ENCOUNTER — Ambulatory Visit (INDEPENDENT_AMBULATORY_CARE_PROVIDER_SITE_OTHER): Payer: 59

## 2021-03-22 DIAGNOSIS — I495 Sick sinus syndrome: Secondary | ICD-10-CM

## 2021-03-26 LAB — CUP PACEART REMOTE DEVICE CHECK
Battery Remaining Longevity: 97 mo
Battery Voltage: 3.01 V
Brady Statistic RV Percent Paced: 0.35 %
Date Time Interrogation Session: 20220415082221
Implantable Lead Implant Date: 20160822
Implantable Lead Location: 753860
Implantable Lead Model: 5076
Implantable Pulse Generator Implant Date: 20160822
Lead Channel Impedance Value: 380 Ohm
Lead Channel Impedance Value: 494 Ohm
Lead Channel Pacing Threshold Amplitude: 0.75 V
Lead Channel Pacing Threshold Pulse Width: 0.4 ms
Lead Channel Sensing Intrinsic Amplitude: 10.375 mV
Lead Channel Sensing Intrinsic Amplitude: 10.375 mV
Lead Channel Setting Pacing Amplitude: 2 V
Lead Channel Setting Pacing Pulse Width: 0.4 ms
Lead Channel Setting Sensing Sensitivity: 2 mV

## 2021-04-08 NOTE — Progress Notes (Signed)
Remote pacemaker transmission.   

## 2021-12-20 ENCOUNTER — Ambulatory Visit (INDEPENDENT_AMBULATORY_CARE_PROVIDER_SITE_OTHER): Payer: 59

## 2021-12-20 DIAGNOSIS — I495 Sick sinus syndrome: Secondary | ICD-10-CM

## 2021-12-20 LAB — CUP PACEART REMOTE DEVICE CHECK
Battery Remaining Longevity: 88 mo
Battery Voltage: 3.01 V
Brady Statistic RV Percent Paced: 0.71 %
Date Time Interrogation Session: 20230113110501
Implantable Lead Implant Date: 20160822
Implantable Lead Location: 753860
Implantable Lead Model: 5076
Implantable Pulse Generator Implant Date: 20160822
Lead Channel Impedance Value: 380 Ohm
Lead Channel Impedance Value: 513 Ohm
Lead Channel Pacing Threshold Amplitude: 0.75 V
Lead Channel Pacing Threshold Pulse Width: 0.4 ms
Lead Channel Sensing Intrinsic Amplitude: 10.75 mV
Lead Channel Sensing Intrinsic Amplitude: 10.75 mV
Lead Channel Setting Pacing Amplitude: 2 V
Lead Channel Setting Pacing Pulse Width: 0.4 ms
Lead Channel Setting Sensing Sensitivity: 2 mV

## 2021-12-31 NOTE — Progress Notes (Signed)
Remote pacemaker transmission.   

## 2024-10-10 ENCOUNTER — Emergency Department (HOSPITAL_COMMUNITY)

## 2024-10-10 ENCOUNTER — Emergency Department (HOSPITAL_COMMUNITY)
Admission: EM | Admit: 2024-10-10 | Discharge: 2024-10-10 | Disposition: A | Discharge status: Home / Self Care | Attending: Emergency Medicine | Admitting: Emergency Medicine

## 2024-10-10 ENCOUNTER — Encounter (HOSPITAL_COMMUNITY): Payer: Self-pay | Admitting: *Deleted

## 2024-10-10 ENCOUNTER — Other Ambulatory Visit: Payer: Self-pay

## 2024-10-10 DIAGNOSIS — S3011XA Contusion of abdominal wall, initial encounter: Secondary | ICD-10-CM | POA: Diagnosis present

## 2024-10-10 DIAGNOSIS — W5512XA Struck by horse, initial encounter: Secondary | ICD-10-CM | POA: Diagnosis not present

## 2024-10-10 LAB — I-STAT CHEM 8, ED
BUN: 9 mg/dL (ref 6–20)
Calcium, Ion: 1.21 mmol/L (ref 1.15–1.40)
Chloride: 103 mmol/L (ref 98–111)
Creatinine, Ser: 0.8 mg/dL (ref 0.44–1.00)
Glucose, Bld: 130 mg/dL — ABNORMAL HIGH (ref 70–99)
HCT: 39 % (ref 36.0–46.0)
Hemoglobin: 13.3 g/dL (ref 12.0–15.0)
Potassium: 3.9 mmol/L (ref 3.5–5.1)
Sodium: 138 mmol/L (ref 135–145)
TCO2: 24 mmol/L (ref 22–32)

## 2024-10-10 LAB — HCG, SERUM, QUALITATIVE: Preg, Serum: NEGATIVE

## 2024-10-10 MED ORDER — IOHEXOL 300 MG/ML  SOLN
100.0000 mL | Freq: Once | INTRAMUSCULAR | Status: AC | PRN
  Administered 2024-10-10: 100 mL via INTRAVENOUS

## 2024-10-10 NOTE — Discharge Instructions (Addendum)
 You were seen today for traumatic injury to your right flank and hip.  X-Kinslie Kelsey Ewing of your right hip shows no evidence of fracture CT scan of your abdomen pelvis shows some contusion of the wall wall in front of the right pelvis.  No injury inside the abdomen Please use acetaminophen, ibuprofen, warm and cold therapy as needed for pain

## 2024-10-10 NOTE — Telephone Encounter (Signed)
 Copied from CRM #[id number]. Topic: Clinical Concerns - Emergent Call >> Oct 10, 2024 12:15 PM Lauraine DASEN wrote:   Due to the Standard Emergency Symptoms Protocol, clinic is being contacted.   Symptom Reported:  kicked by horse in hip, very painful.

## 2024-10-10 NOTE — ED Provider Notes (Signed)
 [place] EMERGENCY DEPARTMENT AT [facility] Provider Note   CSN: [id number] Arrival date & time: 10/10/24  1237     Patient presents with: Leg Injury   Kelsey Ewing is a 38 y.o. female.   HPI   38 year old female who was kicked in the right flank and right hip by her horse.  She states she fell forwards.  Her chief complaint is pain in this area.  She also is endorsing some pain in the right posterior mid back.  She denies head injury or loss of consciousness.  She is not on any blood thinners she denies dyspnea, vomiting, loss of consciousness  Prior to Admission medications   Medication Sig Start Date End Date Taking? Authorizing Provider  busPIRone  (BUSPAR ) 7.5 MG tablet TAKE 1 TABLET (7.5 MG TOTAL) BY MOUTH 3 (THREE) TIMES DAILY. 11/11/19   Kip Ade, NP  etonogestrel-ethinyl estradiol (NUVARING) 0.12-0.015 MG/24HR vaginal ring INSERT 1 VAGINAL RING EVERY MONTH BY VAGINAL ROUTE. 03/14/20   [provider]  meloxicam  (MOBIC ) 15 MG tablet TAKE 1 TABLET BY MOUTH EVERY DAY 12/12/19   Kip Ade, NP    Allergies: Patient has no known allergies.    Review of Systems  Updated Vital Signs BP 107/66   Pulse 83   Temp 99.4 F (37.4 C) (Oral)   Resp 17   Ht 1.708 m (5' 7.25)   Wt 70.8 kg   SpO2 100%   BMI 24.25 kg/m   Physical Exam Vitals reviewed.  HENT:     Head: Normocephalic.     Right Ear: External ear normal.     Left Ear: External ear normal.     Nose: Nose normal.     Mouth/Throat:     Pharynx: Oropharynx is clear.  Eyes:     Extraocular Movements: Extraocular movements intact.     Pupils: Pupils are equal, round, and reactive to light.  Cardiovascular:     Rate and Rhythm: Regular rhythm. Tachycardia present.     Pulses: Normal pulses.  Pulmonary:     Effort: Pulmonary effort is normal.  Abdominal:     General: Abdomen is flat. Bowel sounds are normal.     Comments: Abrasion and contusion right flank Some tenderness in  the right side of the abdomen  Musculoskeletal:        General: Normal range of motion.     Cervical back: Normal range of motion.     Comments: Tenderness over right hip and right hemipelvis pelvis is stable  Skin:    General: Skin is warm and dry.     Capillary Refill: Capillary refill takes less than 2 seconds.  Neurological:     General: No focal deficit present.     Mental Status: She is alert.  Psychiatric:        Mood and Affect: Mood normal.     (all labs ordered are listed, but only abnormal results are displayed) Labs Reviewed  I-STAT CHEM 8, ED - Abnormal; Notable for the following components:      Result Value   Glucose, Bld 130 (*)    All other components within normal limits  HCG, SERUM, QUALITATIVE    EKG: None  Radiology: DG Hip Unilat W or Wo Pelvis 2-3 Views Right Result Date: 10/10/2024 CLINICAL DATA:  Kicked in right hip/upper thigh by a horse, pain, difficulty ambulating EXAM: DG HIP (WITH OR WITHOUT PELVIS) 2-3V*R* COMPARISON:  None Available. FINDINGS: Frontal view of the pelvis as well as frontal  and cross-table lateral views of the right hip are obtained. No acute displaced fracture, subluxation, or dislocation. Joint spaces are well preserved. Sacroiliac joints are normal. IMPRESSION: 1. Unremarkable pelvis and right hip. Electronically Signed   By: Ozell Daring M.D.   On: 10/10/2024 15:10   CT ABDOMEN PELVIS W CONTRAST Result Date: 10/10/2024 CLINICAL DATA:  Kicked in right side by a horse, difficulty ambulating EXAM: CT ABDOMEN AND PELVIS WITH CONTRAST TECHNIQUE: Multidetector CT imaging of the abdomen and pelvis was performed using the standard protocol following bolus administration of intravenous contrast. RADIATION DOSE REDUCTION: This exam was performed according to the departmental dose-optimization program which includes automated exposure control, adjustment of the mA and/or kV according to patient size and/or use of iterative reconstruction  technique. CONTRAST:  100mL OMNIPAQUE IOHEXOL 300 MG/ML  SOLN COMPARISON:  None Available. FINDINGS: Lower chest: No acute pleural or parenchymal lung disease. Hepatobiliary: No hepatic injury or perihepatic hematoma. Gallbladder is unremarkable. Pancreas: Unremarkable. No pancreatic ductal dilatation or surrounding inflammatory changes. Spleen: No splenic injury or perisplenic hematoma. Adrenals/Urinary Tract: No adrenal hemorrhage or renal injury identified. Bladder is decompressed, limiting its evaluation. No evidence of bladder trauma. Stomach/Bowel: No bowel obstruction or ileus. Normal appendix right lower quadrant. No bowel wall thickening or inflammatory change. Vascular/Lymphatic: No significant vascular findings are present. No enlarged abdominal or pelvic lymph nodes. Reproductive: Uterus and bilateral adnexa are unremarkable. Other: No free fluid or free intraperitoneal gas. No abdominal wall hernia. Musculoskeletal: There is subcutaneous fat stranding within the anterior right hemipelvis, likely contusion from reported trauma. No fluid collection or hematoma. There are no acute or destructive bony abnormalities. Reconstructed images demonstrate no additional findings. IMPRESSION: 1. Subcutaneous fat stranding within the right anterior pelvis overlying the iliac bone, consistent with contusion from direct trauma. No fluid collection or hematoma. 2. Otherwise no acute intra-abdominal or intrapelvic trauma. Electronically Signed   By: Ozell Daring M.D.   On: 10/10/2024 15:09   DG Chest Port 1 View Result Date: 10/10/2024 CLINICAL DATA:  Kicked by a horse EXAM: PORTABLE CHEST 1 VIEW COMPARISON:  11/16/2019 FINDINGS: Single frontal view of the chest demonstrates a stable single lead pacer overlying the left chest. The cardiac silhouette is unremarkable. No airspace disease, effusion, or pneumothorax. No acute displaced fracture. IMPRESSION: 1. No acute intrathoracic process. Electronically Signed   By:  Ozell Daring M.D.   On: 10/10/2024 15:06     Procedures   Medications Ordered in the ED  iohexol (OMNIPAQUE) 300 MG/ML solution 100 mL (100 mLs Intravenous Contrast Given 10/10/24 1435)    Clinical Course as of 10/10/24 1524  Mon Oct 10, 2024  1521 Right hip x-Naraly Fritcher reviewed interpreted within normal limits [DR]  1521 CT abdomen pelvis shows a subcutaneous fat stranding within the right anterior pelvis overlying the iliac bone consistent with contusion but no other acute intra-abdominal or intrapelvic injury noted [DR]  1521 Chest x-Humphrey Guerreiro with no acute intrathoracic process [DR]    Clinical Course User Index [DR] Levander Houston, MD                                 Medical Decision Making Amount and/or Complexity of Data Reviewed Labs: ordered. Radiology: ordered.  Risk Prescription drug management.   Patient kicked by horse in the right flank to right hip area. Imaging obtained of right hip, abdomen and pelvis No evidence of acute injury noted Patient appears stable for  discharge     Final diagnoses:  Contusion of abdominal wall, initial encounter    ED Discharge Orders     None          Levander Houston, MD 10/10/24 1524

## 2024-10-10 NOTE — ED Triage Notes (Signed)
 Walking horse, horse was spooked and kicked pt in rt side near hip/ upper thigh area. Pt able to ambulate with difficulty
